# Patient Record
Sex: Female | Born: 1945 | Race: White | Hispanic: No | Marital: Married | Smoking: Never smoker
Health system: Southern US, Community
[De-identification: ages and names within clinical notes are randomized; demographics above are authoritative.]

## PROBLEM LIST (undated history)

## (undated) DIAGNOSIS — Z8719 Personal history of other diseases of the digestive system: Secondary | ICD-10-CM

## (undated) DIAGNOSIS — C4491 Basal cell carcinoma of skin, unspecified: Secondary | ICD-10-CM

## (undated) DIAGNOSIS — T7840XA Allergy, unspecified, initial encounter: Secondary | ICD-10-CM

## (undated) DIAGNOSIS — K635 Polyp of colon: Secondary | ICD-10-CM

## (undated) DIAGNOSIS — K649 Unspecified hemorrhoids: Secondary | ICD-10-CM

## (undated) DIAGNOSIS — C50919 Malignant neoplasm of unspecified site of unspecified female breast: Secondary | ICD-10-CM

## (undated) DIAGNOSIS — M810 Age-related osteoporosis without current pathological fracture: Secondary | ICD-10-CM

## (undated) DIAGNOSIS — R109 Unspecified abdominal pain: Secondary | ICD-10-CM

## (undated) DIAGNOSIS — C801 Malignant (primary) neoplasm, unspecified: Secondary | ICD-10-CM

## (undated) DIAGNOSIS — M722 Plantar fascial fibromatosis: Secondary | ICD-10-CM

## (undated) HISTORY — DX: Basal cell carcinoma of skin, unspecified: C44.91

## (undated) HISTORY — DX: Unspecified abdominal pain: R10.9

## (undated) HISTORY — DX: Polyp of colon: K63.5

## (undated) HISTORY — DX: Plantar fascial fibromatosis: M72.2

## (undated) HISTORY — DX: Malignant (primary) neoplasm, unspecified: C80.1

## (undated) HISTORY — DX: Personal history of other diseases of the digestive system: Z87.19

## (undated) HISTORY — DX: Allergy, unspecified, initial encounter: T78.40XA

## (undated) HISTORY — DX: Malignant neoplasm of unspecified site of unspecified female breast: C50.919

## (undated) HISTORY — DX: Unspecified hemorrhoids: K64.9

## (undated) HISTORY — DX: Age-related osteoporosis without current pathological fracture: M81.0

---

## 1968-04-07 HISTORY — PX: BREAST CYST EXCISION: SHX579

## 2000-04-07 DIAGNOSIS — C801 Malignant (primary) neoplasm, unspecified: Secondary | ICD-10-CM

## 2000-04-07 HISTORY — PX: MASTECTOMY: SHX3

## 2000-04-07 HISTORY — DX: Malignant (primary) neoplasm, unspecified: C80.1

## 2000-06-05 HISTORY — PX: OTHER SURGICAL HISTORY: SHX169

## 2000-06-08 ENCOUNTER — Other Ambulatory Visit: Admission: RE | Admit: 2000-06-08 | Discharge: 2000-06-08 | Payer: Self-pay | Admitting: Obstetrics & Gynecology

## 2000-06-10 ENCOUNTER — Encounter: Admission: RE | Admit: 2000-06-10 | Discharge: 2000-06-10 | Payer: Self-pay | Admitting: Obstetrics & Gynecology

## 2000-06-10 ENCOUNTER — Other Ambulatory Visit: Admission: RE | Admit: 2000-06-10 | Discharge: 2000-06-10 | Payer: Self-pay | Admitting: Obstetrics & Gynecology

## 2000-06-10 ENCOUNTER — Encounter (INDEPENDENT_AMBULATORY_CARE_PROVIDER_SITE_OTHER): Payer: Self-pay | Admitting: *Deleted

## 2000-06-10 ENCOUNTER — Encounter: Payer: Self-pay | Admitting: Obstetrics & Gynecology

## 2000-06-16 ENCOUNTER — Ambulatory Visit (HOSPITAL_COMMUNITY): Admission: RE | Admit: 2000-06-16 | Discharge: 2000-06-16 | Payer: Self-pay | Admitting: Surgery

## 2000-06-16 ENCOUNTER — Encounter: Payer: Self-pay | Admitting: Surgery

## 2000-06-18 ENCOUNTER — Ambulatory Visit (HOSPITAL_COMMUNITY): Admission: RE | Admit: 2000-06-18 | Discharge: 2000-06-18 | Payer: Self-pay | Admitting: Surgery

## 2000-06-18 ENCOUNTER — Encounter: Payer: Self-pay | Admitting: Surgery

## 2000-06-26 ENCOUNTER — Inpatient Hospital Stay (HOSPITAL_COMMUNITY): Admission: RE | Admit: 2000-06-26 | Discharge: 2000-06-27 | Payer: Self-pay | Admitting: Surgery

## 2000-06-26 ENCOUNTER — Encounter: Payer: Self-pay | Admitting: Surgery

## 2000-06-26 ENCOUNTER — Encounter (INDEPENDENT_AMBULATORY_CARE_PROVIDER_SITE_OTHER): Payer: Self-pay

## 2000-10-15 ENCOUNTER — Ambulatory Visit: Admission: RE | Admit: 2000-10-15 | Discharge: 2001-01-13 | Payer: Self-pay | Admitting: Radiation Oncology

## 2001-06-14 ENCOUNTER — Encounter: Payer: Self-pay | Admitting: Hematology and Oncology

## 2001-06-14 ENCOUNTER — Encounter: Admission: RE | Admit: 2001-06-14 | Discharge: 2001-06-14 | Payer: Self-pay | Admitting: Hematology and Oncology

## 2001-07-09 ENCOUNTER — Other Ambulatory Visit: Admission: RE | Admit: 2001-07-09 | Discharge: 2001-07-09 | Payer: Self-pay | Admitting: Family Medicine

## 2002-05-27 ENCOUNTER — Ambulatory Visit (HOSPITAL_COMMUNITY): Admission: RE | Admit: 2002-05-27 | Discharge: 2002-05-27 | Payer: Self-pay | Admitting: Oncology

## 2002-05-27 ENCOUNTER — Encounter: Payer: Self-pay | Admitting: Oncology

## 2002-09-09 ENCOUNTER — Encounter: Admission: RE | Admit: 2002-09-09 | Discharge: 2002-09-09 | Payer: Self-pay | Admitting: Oncology

## 2002-09-09 ENCOUNTER — Encounter: Payer: Self-pay | Admitting: Oncology

## 2003-01-27 ENCOUNTER — Other Ambulatory Visit: Admission: RE | Admit: 2003-01-27 | Discharge: 2003-01-27 | Payer: Self-pay | Admitting: Family Medicine

## 2003-10-31 ENCOUNTER — Encounter: Admission: RE | Admit: 2003-10-31 | Discharge: 2003-10-31 | Payer: Self-pay | Admitting: Surgery

## 2004-04-04 ENCOUNTER — Ambulatory Visit: Payer: Self-pay | Admitting: Family Medicine

## 2004-07-08 ENCOUNTER — Ambulatory Visit: Payer: Self-pay | Admitting: Oncology

## 2004-10-09 ENCOUNTER — Ambulatory Visit: Payer: Self-pay | Admitting: Family Medicine

## 2004-11-05 ENCOUNTER — Encounter: Admission: RE | Admit: 2004-11-05 | Discharge: 2004-11-05 | Payer: Self-pay | Admitting: Oncology

## 2004-11-29 ENCOUNTER — Encounter (INDEPENDENT_AMBULATORY_CARE_PROVIDER_SITE_OTHER): Payer: Self-pay | Admitting: *Deleted

## 2004-11-29 ENCOUNTER — Ambulatory Visit: Payer: Self-pay | Admitting: Family Medicine

## 2004-11-29 ENCOUNTER — Encounter: Payer: Self-pay | Admitting: Family Medicine

## 2004-11-29 ENCOUNTER — Other Ambulatory Visit: Admission: RE | Admit: 2004-11-29 | Discharge: 2004-11-29 | Payer: Self-pay | Admitting: Family Medicine

## 2004-11-29 LAB — CONVERTED CEMR LAB: Pap Smear: NORMAL

## 2005-07-15 ENCOUNTER — Ambulatory Visit: Payer: Self-pay | Admitting: Oncology

## 2005-07-15 LAB — COMPREHENSIVE METABOLIC PANEL
AST: 11 U/L (ref 0–37)
Albumin: 4.6 g/dL (ref 3.5–5.2)
Alkaline Phosphatase: 79 U/L (ref 39–117)
Potassium: 4.2 mEq/L (ref 3.5–5.3)
Sodium: 141 mEq/L (ref 135–145)
Total Protein: 7.2 g/dL (ref 6.0–8.3)

## 2005-07-15 LAB — CBC WITH DIFFERENTIAL/PLATELET
BASO%: 0.5 % (ref 0.0–2.0)
EOS%: 2.3 % (ref 0.0–7.0)
HGB: 14 g/dL (ref 11.6–15.9)
MCH: 32 pg (ref 26.0–34.0)
MCHC: 34.5 g/dL (ref 32.0–36.0)
RDW: 11.9 % (ref 11.3–14.5)
lymph#: 1.2 10*3/uL (ref 0.9–3.3)

## 2005-12-23 ENCOUNTER — Encounter: Admission: RE | Admit: 2005-12-23 | Discharge: 2005-12-23 | Payer: Self-pay | Admitting: Surgery

## 2005-12-29 ENCOUNTER — Encounter: Admission: RE | Admit: 2005-12-29 | Discharge: 2005-12-29 | Payer: Self-pay | Admitting: Surgery

## 2005-12-30 ENCOUNTER — Encounter: Admission: RE | Admit: 2005-12-30 | Discharge: 2005-12-30 | Payer: Self-pay | Admitting: Surgery

## 2006-07-09 ENCOUNTER — Ambulatory Visit: Payer: Self-pay | Admitting: Oncology

## 2006-09-02 ENCOUNTER — Ambulatory Visit: Payer: Self-pay | Admitting: Oncology

## 2006-09-04 LAB — CBC WITH DIFFERENTIAL/PLATELET
Basophils Absolute: 0 10*3/uL (ref 0.0–0.1)
EOS%: 2 % (ref 0.0–7.0)
MCH: 32.6 pg (ref 26.0–34.0)
MCV: 91.3 fL (ref 81.0–101.0)
MONO%: 5.5 % (ref 0.0–13.0)
RBC: 4.16 10*6/uL (ref 3.70–5.32)
RDW: 12.3 % (ref 11.3–14.5)

## 2006-09-04 LAB — COMPREHENSIVE METABOLIC PANEL
ALT: 11 U/L (ref 0–35)
Alkaline Phosphatase: 86 U/L (ref 39–117)
CO2: 23 mEq/L (ref 19–32)
Creatinine, Ser: 0.75 mg/dL (ref 0.40–1.20)
Glucose, Bld: 90 mg/dL (ref 70–99)
Sodium: 140 mEq/L (ref 135–145)
Total Bilirubin: 0.6 mg/dL (ref 0.3–1.2)
Total Protein: 6.7 g/dL (ref 6.0–8.3)

## 2006-09-04 LAB — CANCER ANTIGEN 27.29: CA 27.29: 27 U/mL (ref 0–39)

## 2007-03-16 ENCOUNTER — Encounter: Admission: RE | Admit: 2007-03-16 | Discharge: 2007-03-16 | Payer: Self-pay | Admitting: Surgery

## 2007-08-25 ENCOUNTER — Ambulatory Visit: Payer: Self-pay | Admitting: Oncology

## 2007-09-03 LAB — CBC WITH DIFFERENTIAL/PLATELET
BASO%: 0.4 % (ref 0.0–2.0)
Basophils Absolute: 0 10*3/uL (ref 0.0–0.1)
Eosinophils Absolute: 0.1 10*3/uL (ref 0.0–0.5)
HCT: 42 % (ref 34.8–46.6)
HGB: 14.6 g/dL (ref 11.6–15.9)
MCHC: 34.7 g/dL (ref 32.0–36.0)
MONO#: 0.4 10*3/uL (ref 0.1–0.9)
NEUT#: 4.7 10*3/uL (ref 1.5–6.5)
NEUT%: 72.7 % (ref 39.6–76.8)
WBC: 6.5 10*3/uL (ref 3.9–10.0)
lymph#: 1.3 10*3/uL (ref 0.9–3.3)

## 2007-09-03 LAB — COMPREHENSIVE METABOLIC PANEL
ALT: 15 U/L (ref 0–35)
CO2: 25 mEq/L (ref 19–32)
Calcium: 9.5 mg/dL (ref 8.4–10.5)
Chloride: 106 mEq/L (ref 96–112)
Creatinine, Ser: 0.76 mg/dL (ref 0.40–1.20)
Glucose, Bld: 103 mg/dL — ABNORMAL HIGH (ref 70–99)

## 2007-09-03 LAB — CANCER ANTIGEN 27.29: CA 27.29: 34 U/mL (ref 0–39)

## 2007-09-13 ENCOUNTER — Encounter: Payer: Self-pay | Admitting: Family Medicine

## 2007-09-13 ENCOUNTER — Encounter: Payer: Self-pay | Admitting: Internal Medicine

## 2007-10-05 ENCOUNTER — Encounter: Payer: Self-pay | Admitting: Internal Medicine

## 2008-01-28 ENCOUNTER — Encounter: Payer: Self-pay | Admitting: Family Medicine

## 2008-03-14 ENCOUNTER — Encounter: Admission: RE | Admit: 2008-03-14 | Discharge: 2008-03-14 | Payer: Self-pay | Admitting: Surgery

## 2008-06-16 ENCOUNTER — Encounter (INDEPENDENT_AMBULATORY_CARE_PROVIDER_SITE_OTHER): Payer: Self-pay | Admitting: *Deleted

## 2008-06-16 ENCOUNTER — Encounter: Payer: Self-pay | Admitting: Family Medicine

## 2008-06-16 ENCOUNTER — Other Ambulatory Visit: Admission: RE | Admit: 2008-06-16 | Discharge: 2008-06-16 | Payer: Self-pay | Admitting: Family Medicine

## 2008-06-16 ENCOUNTER — Ambulatory Visit: Payer: Self-pay | Admitting: Family Medicine

## 2008-06-16 DIAGNOSIS — Z853 Personal history of malignant neoplasm of breast: Secondary | ICD-10-CM

## 2008-06-16 DIAGNOSIS — M810 Age-related osteoporosis without current pathological fracture: Secondary | ICD-10-CM

## 2008-06-22 ENCOUNTER — Encounter (INDEPENDENT_AMBULATORY_CARE_PROVIDER_SITE_OTHER): Payer: Self-pay | Admitting: *Deleted

## 2008-06-23 ENCOUNTER — Ambulatory Visit: Payer: Self-pay | Admitting: Family Medicine

## 2008-06-23 DIAGNOSIS — E7800 Pure hypercholesterolemia, unspecified: Secondary | ICD-10-CM

## 2008-06-30 LAB — CONVERTED CEMR LAB: Vit D, 25-Hydroxy: 19 ng/mL — ABNORMAL LOW (ref 30–89)

## 2008-07-04 ENCOUNTER — Encounter (INDEPENDENT_AMBULATORY_CARE_PROVIDER_SITE_OTHER): Payer: Self-pay | Admitting: *Deleted

## 2008-07-04 LAB — CONVERTED CEMR LAB
BUN: 17 mg/dL (ref 6–23)
Basophils Absolute: 0 10*3/uL (ref 0.0–0.1)
Bilirubin, Direct: 0 mg/dL (ref 0.0–0.3)
Chloride: 110 meq/L (ref 96–112)
Cholesterol: 214 mg/dL — ABNORMAL HIGH (ref 0–200)
Creatinine, Ser: 0.7 mg/dL (ref 0.4–1.2)
Direct LDL: 138.6 mg/dL
GFR calc non Af Amer: 90.19 mL/min (ref >60)
Glucose, Bld: 88 mg/dL (ref 70–99)
HCT: 40.6 % (ref 36.0–46.0)
HDL: 52.6 mg/dL (ref >39.00)
Lymphs Abs: 1.3 10*3/uL (ref 0.7–4.0)
MCV: 94.6 fL (ref 78.0–100.0)
Monocytes Absolute: 0.3 10*3/uL (ref 0.1–1.0)
Neutrophils Relative %: 61.3 % (ref 43.0–77.0)
Platelets: 146 10*3/uL — ABNORMAL LOW (ref 150.0–400.0)
Potassium: 4.5 meq/L (ref 3.5–5.1)
RDW: 11.4 % — ABNORMAL LOW (ref 11.5–14.6)
TSH: 1.53 microintl units/mL (ref 0.35–5.50)
Total Bilirubin: 1.1 mg/dL (ref 0.3–1.2)
VLDL: 21 mg/dL (ref 0.0–40.0)
WBC: 4.6 10*3/uL (ref 4.5–10.5)

## 2008-07-18 ENCOUNTER — Ambulatory Visit: Payer: Self-pay | Admitting: Family Medicine

## 2008-09-29 ENCOUNTER — Ambulatory Visit: Payer: Self-pay | Admitting: Family Medicine

## 2008-10-04 LAB — CONVERTED CEMR LAB
ALT: 14 units/L (ref 0–35)
AST: 19 units/L (ref 0–37)
Cholesterol: 225 mg/dL — ABNORMAL HIGH (ref 0–200)
Vit D, 25-Hydroxy: 38 ng/mL (ref 30–89)

## 2008-11-22 ENCOUNTER — Ambulatory Visit: Payer: Self-pay | Admitting: Internal Medicine

## 2008-12-06 ENCOUNTER — Ambulatory Visit: Payer: Self-pay | Admitting: Internal Medicine

## 2008-12-06 ENCOUNTER — Encounter: Payer: Self-pay | Admitting: Internal Medicine

## 2008-12-06 LAB — HM COLONOSCOPY

## 2008-12-08 ENCOUNTER — Encounter: Payer: Self-pay | Admitting: Internal Medicine

## 2009-03-16 ENCOUNTER — Encounter: Admission: RE | Admit: 2009-03-16 | Discharge: 2009-03-16 | Payer: Self-pay | Admitting: Surgery

## 2009-03-20 ENCOUNTER — Encounter (INDEPENDENT_AMBULATORY_CARE_PROVIDER_SITE_OTHER): Payer: Self-pay | Admitting: *Deleted

## 2010-04-19 ENCOUNTER — Encounter
Admission: RE | Admit: 2010-04-19 | Discharge: 2010-04-19 | Payer: Self-pay | Source: Home / Self Care | Attending: Surgery | Admitting: Surgery

## 2010-04-19 LAB — HM MAMMOGRAPHY

## 2010-04-26 ENCOUNTER — Encounter
Admission: RE | Admit: 2010-04-26 | Discharge: 2010-04-26 | Payer: Self-pay | Source: Home / Self Care | Attending: Surgery | Admitting: Surgery

## 2010-04-26 ENCOUNTER — Encounter: Payer: Self-pay | Admitting: Family Medicine

## 2010-05-06 ENCOUNTER — Other Ambulatory Visit: Payer: Self-pay | Admitting: Surgery

## 2010-05-06 DIAGNOSIS — N6001 Solitary cyst of right breast: Secondary | ICD-10-CM

## 2010-05-06 DIAGNOSIS — Z9012 Acquired absence of left breast and nipple: Secondary | ICD-10-CM

## 2010-05-06 DIAGNOSIS — R922 Inconclusive mammogram: Secondary | ICD-10-CM

## 2010-05-23 NOTE — Letter (Signed)
Summary: Dr Jamey Ripa note  Dr Jamey Ripa note   Imported By: Kassie Mends 05/10/2010 11:34:57  _____________________________________________________________________  External Attachment:    Type:   Image     Comment:   External Document

## 2010-05-31 ENCOUNTER — Ambulatory Visit
Admission: RE | Admit: 2010-05-31 | Discharge: 2010-05-31 | Disposition: A | Discharge status: Home / Self Care | Payer: Self-pay | Source: Ambulatory Visit | Attending: Surgery | Admitting: Surgery

## 2010-05-31 DIAGNOSIS — Z9012 Acquired absence of left breast and nipple: Secondary | ICD-10-CM

## 2010-05-31 DIAGNOSIS — N6001 Solitary cyst of right breast: Secondary | ICD-10-CM

## 2010-05-31 DIAGNOSIS — R922 Inconclusive mammogram: Secondary | ICD-10-CM

## 2010-05-31 MED ORDER — GADOBENATE DIMEGLUMINE 529 MG/ML IV SOLN
15.0000 mL | Freq: Once | INTRAVENOUS | Status: AC | PRN
  Administered 2010-05-31: 15 mL via INTRAVENOUS

## 2010-05-31 MED ORDER — GADOBENATE DIMEGLUMINE 529 MG/ML IV SOLN: 15.0000 mL | Freq: Once | INTRAVENOUS | Status: DC | PRN

## 2010-08-23 NOTE — Op Note (Signed)
[place]  Patient:    Tina Mendez, Tina Mendez                        MRN: [id number] Proc. Date: 06/26/00 Adm. Date:  [id number] Disc. Date: [id number] Attending:  Charlton Haws CC:         Freddy Finner, M.D.  Dr. ___________ [facility]  [place] C. Catha Gosselin, M.D.   Operative Report  ACCOUNT:  [id number]  OFFICE MEDICAL RECORD NUMBER:  [id number]  PREOPERATIVE DIAGNOSIS:  Carcinoma of the left breast.  POSTOPERATIVE DIAGNOSIS:  Carcinoma of the left breast.  OPERATION:  Left total mastectomy with blue dye injection and sentinel node dissection.  SURGEON:  Currie Paris, M.D.  ASSISTANT:  Emiliano Dyer, R.N.F.A.  CLINICAL HISTORY:  This patient is a 65 year old, who has recently presented with a left breast mass which looked malignant on mammography and was palpable.  It was medially located in the upper inner quadrant.  Cold biopsy showed invasive carcinoma.  She had already seen Dr. Lyndal Pulley for preoperative evaluation for chemo, and it was elected to proceed to total mastectomy because this was felt to be too large for a successful partial mastectomy and sentinel node dissection with a possible full axillary dissection.  DESCRIPTION OF PROCEDURE:  The patient was brought to the operating room and having then had the breast mass palpated in the holding area and marked.  She already been injected with her radioactive nucleotide.  After satisfactory general endotracheal anesthesia had been obtained, the breast was prepped and draped.  I outlined an elliptical incision to be made. I injected 5 cc of Lymphazurin blue, most of which was subareolar, but a little bit in the dermal area over the tumor.  We had several minutes for that to soak in and then began the mastectomy.  I made the superior incision and raised a skin flap medially to the sternum superiorly almost to the clavicle, laterally to the edge of the pectoralis, and  then lifted the axillary skin up. I had already used a needle probe to identify a hot area and once I got the skin elevated off of that, using needle probe again to divide down through some of the subcutaneous tissue until I entered some axillary fat.  I found a blue-green lymphatic vessel, and it tracked towards where our hot area was and tracing this out, found a hot blue node which was grasped with a Babcock and excised.  Using the needle probe, I found what appeared to be two other nodes, but actually one of them was two nodes together, and these were all hot but not blue.  They were dissected, and all were sent for touch preps.  These all subsequently returned negative.  While waiting for those to return, we went ahead and completed the mastectomy. The inferior incision was made and skin flap raised inferiorly to the inframammary followed laterally to the latissimus dorsi.  The breasts was dissected off the underlying muscles, starting medially and superiorly, and working inferiorly and laterally.  The fascia was taken.  The final attachments to the latissimus were divided and then the final attachments to the fatty tissue and the axilla were also divided.  When I had completed the initial sentinel node dissection, there were no other hot areas noted nor any other blue areas nor any palpable adenopathy.  This completed the mastectomy.  The pathologist had reported that the nodes were negative by  touch preps.  The wound was irrigated and checked for hemostasis.  Everything appeared to be completely dry.  The 19 Blake drains were placed through the inferior flap, one into the axilla, one over the pectoralis.  Another check was made for hemostasis and the wound then closed with staples.  There was a little bit of tension medially but seemed to close okay.  The drains were secured with a nylon.  The patient tolerated the procedure well.  There were no operative complications.  All  counts were correct. DD:  06/26/00 TD:  06/28/00 Job: [id number]

## 2010-08-27 ENCOUNTER — Encounter (INDEPENDENT_AMBULATORY_CARE_PROVIDER_SITE_OTHER): Payer: Self-pay | Admitting: Surgery

## 2011-03-13 ENCOUNTER — Ambulatory Visit: Payer: Self-pay

## 2011-03-20 ENCOUNTER — Encounter (INDEPENDENT_AMBULATORY_CARE_PROVIDER_SITE_OTHER): Payer: Self-pay | Admitting: Surgery

## 2011-03-28 ENCOUNTER — Other Ambulatory Visit (INDEPENDENT_AMBULATORY_CARE_PROVIDER_SITE_OTHER): Payer: Self-pay | Admitting: Surgery

## 2011-03-28 DIAGNOSIS — Z1231 Encounter for screening mammogram for malignant neoplasm of breast: Secondary | ICD-10-CM

## 2011-03-28 DIAGNOSIS — Z9012 Acquired absence of left breast and nipple: Secondary | ICD-10-CM

## 2011-05-16 ENCOUNTER — Ambulatory Visit
Admission: RE | Admit: 2011-05-16 | Discharge: 2011-05-16 | Disposition: A | Discharge status: Home / Self Care | Payer: Federal, State, Local not specified - PPO | Source: Ambulatory Visit | Attending: Surgery | Admitting: Surgery

## 2011-05-16 DIAGNOSIS — Z9012 Acquired absence of left breast and nipple: Secondary | ICD-10-CM

## 2011-05-16 DIAGNOSIS — Z1231 Encounter for screening mammogram for malignant neoplasm of breast: Secondary | ICD-10-CM

## 2011-05-27 ENCOUNTER — Encounter: Payer: Self-pay | Admitting: *Deleted

## 2011-06-05 ENCOUNTER — Encounter (INDEPENDENT_AMBULATORY_CARE_PROVIDER_SITE_OTHER): Payer: Self-pay | Admitting: General Surgery

## 2011-06-06 ENCOUNTER — Encounter (INDEPENDENT_AMBULATORY_CARE_PROVIDER_SITE_OTHER): Payer: Self-pay | Admitting: Surgery

## 2011-06-06 ENCOUNTER — Ambulatory Visit (INDEPENDENT_AMBULATORY_CARE_PROVIDER_SITE_OTHER): Payer: Federal, State, Local not specified - PPO | Admitting: Surgery

## 2011-06-06 VITALS — BP 118/74 | HR 80 | Temp 97.0°F | Resp 16 | Ht 63.0 in | Wt 164.2 lb

## 2011-06-06 DIAGNOSIS — Z853 Personal history of malignant neoplasm of breast: Secondary | ICD-10-CM

## 2011-06-06 NOTE — Progress Notes (Signed)
NAME: [name]       DOB: 09-05-1945           DATE: 06/06/2011       MRN: [id number]   Tina Mendez is a 66 y.o.Marland Kitchenfemale who presents for routine followup of her Left breast cancer diagnosed in 2002 and treated with mastectomy and chemo. She has no problems or concerns on either side.  PFSH: She has had no significant changes since the last visit here.  ROS: There have been no significant changes since the last visit here  EXAM: General: The patient is alert, oriented, generally healty appearing, NAD. Mood and affect are normal.  Breasts:  Right is normal. Left is s/p mastectomy and no evidence of recurrence  Lymphatics: She has no axillary or supraclavicular adenopathy on either side.  Extremities: Full ROM of the surgical side with no lymphedema noted.  Data Reviewed: Mammogram is OK  Impression: Doing well, with no evidence of recurrent cancer or new cancer  Plan: RTC PRN

## 2011-06-06 NOTE — Patient Instructions (Signed)
We will see you again on an as needed basis. Please call the office at [phone] if you have any questions or concerns. Thank you for allowing us to take care of you.  

## 2011-09-26 ENCOUNTER — Encounter: Payer: Self-pay | Admitting: Family Medicine

## 2011-09-26 ENCOUNTER — Ambulatory Visit (INDEPENDENT_AMBULATORY_CARE_PROVIDER_SITE_OTHER): Payer: Medicare Other | Admitting: Family Medicine

## 2011-09-26 ENCOUNTER — Telehealth: Payer: Self-pay | Admitting: Family Medicine

## 2011-09-26 VITALS — BP 120/70 | HR 78 | Temp 98.3°F | Wt 162.0 lb

## 2011-09-26 DIAGNOSIS — L738 Other specified follicular disorders: Secondary | ICD-10-CM

## 2011-09-26 DIAGNOSIS — L739 Follicular disorder, unspecified: Secondary | ICD-10-CM | POA: Insufficient documentation

## 2011-09-26 MED ORDER — DOXYCYCLINE HYCLATE 100 MG PO TABS: 100.0000 mg | ORAL_TABLET | Freq: Two times a day (BID) | ORAL | Status: AC

## 2011-09-26 NOTE — Assessment & Plan Note (Signed)
Presumed MRSA folliculitis.  Doxy, called in.  Use dial soap and avoid sharing washcloths.  Should resolve, f/u prn.

## 2011-09-26 NOTE — Progress Notes (Signed)
Grandson had a rash, thought to be due to insect bites.  Then was rechecked when not improved on amoxil and was diagnosed with MRSA.  That was 1 week ago, MRSA on culture per patient report.  Pt with no FCNAVD.  Now patient has bumps noted in groin and on legs. Not itchy.  No tick bites that were engorged.  She had some ticks on her prev, but not attached.    Meds, vitals, and allergies reviewed.   ROS: See HPI.  Otherwise, noncontributory.  nad Chaperoned exam with mult ~1cm blanching red area along the B proximal thighs with central white area, some associated with a hair follicle.  No fluctuant mass.

## 2011-09-26 NOTE — Telephone Encounter (Signed)
Caller: Manuel/Patient; PCP: Roxy Manns A.; CB#: [phone]; Calling today 09/26/11 regarding she has a rash.  Has a grandson who is 3 who recently broke out in a rash and was taken to MD and was told it was insect bites.  Went back again b/c rash not better and rash was positive for MRSA.  Grandmother said last night she noticed about 12 bumps in groin area that are red and look like a pimples but do not hurt or itch.  Says look like they may have pus in them.  Rash is in her panty line area and on back of her leg.  Afebrile.  Emergent symptoms r/o by Skin Lesions guidelines with exception of new signs and symptoms of local infection. Appt scheduled for today 09/26/11 at 4 PM with Dr. Para March.

## 2011-09-26 NOTE — Patient Instructions (Addendum)
Start the doxycycline today.  Use dial soap and don't share washcloths.  This should gradually improve.  If fever or significant pain, notify the clinic.

## 2011-12-22 DIAGNOSIS — L570 Actinic keratosis: Secondary | ICD-10-CM | POA: Diagnosis not present

## 2011-12-22 DIAGNOSIS — L821 Other seborrheic keratosis: Secondary | ICD-10-CM | POA: Diagnosis not present

## 2011-12-22 DIAGNOSIS — L981 Factitial dermatitis: Secondary | ICD-10-CM | POA: Diagnosis not present

## 2011-12-22 DIAGNOSIS — L820 Inflamed seborrheic keratosis: Secondary | ICD-10-CM | POA: Diagnosis not present

## 2011-12-22 DIAGNOSIS — D239 Other benign neoplasm of skin, unspecified: Secondary | ICD-10-CM | POA: Diagnosis not present

## 2012-01-09 ENCOUNTER — Encounter: Payer: Self-pay | Admitting: Internal Medicine

## 2012-03-12 ENCOUNTER — Telehealth (INDEPENDENT_AMBULATORY_CARE_PROVIDER_SITE_OTHER): Payer: Self-pay

## 2012-03-12 NOTE — Telephone Encounter (Signed)
Pt called from [facility] requesting rx be sent today for Mastectomy bras and prosthesis. Pt requests no # of supplies be put on rx. She request signed rx be faxed to [phone] [facility]. Pt advised I will send request to Dr Jamey Ripa.

## 2012-03-12 NOTE — Telephone Encounter (Signed)
Rx sent to [facility]. Confirmation received.

## 2012-04-29 DIAGNOSIS — L578 Other skin changes due to chronic exposure to nonionizing radiation: Secondary | ICD-10-CM | POA: Diagnosis not present

## 2012-04-29 DIAGNOSIS — C4401 Basal cell carcinoma of skin of lip: Secondary | ICD-10-CM | POA: Diagnosis not present

## 2012-04-29 DIAGNOSIS — L819 Disorder of pigmentation, unspecified: Secondary | ICD-10-CM | POA: Diagnosis not present

## 2012-04-29 DIAGNOSIS — D485 Neoplasm of uncertain behavior of skin: Secondary | ICD-10-CM | POA: Diagnosis not present

## 2012-04-29 DIAGNOSIS — L570 Actinic keratosis: Secondary | ICD-10-CM | POA: Diagnosis not present

## 2012-04-29 DIAGNOSIS — Z85828 Personal history of other malignant neoplasm of skin: Secondary | ICD-10-CM | POA: Diagnosis not present

## 2012-06-30 DIAGNOSIS — C4401 Basal cell carcinoma of skin of lip: Secondary | ICD-10-CM | POA: Diagnosis not present

## 2012-07-05 DIAGNOSIS — Z85828 Personal history of other malignant neoplasm of skin: Secondary | ICD-10-CM | POA: Diagnosis not present

## 2012-07-05 DIAGNOSIS — L570 Actinic keratosis: Secondary | ICD-10-CM | POA: Diagnosis not present

## 2012-07-05 DIAGNOSIS — L578 Other skin changes due to chronic exposure to nonionizing radiation: Secondary | ICD-10-CM | POA: Diagnosis not present

## 2012-07-16 ENCOUNTER — Other Ambulatory Visit: Payer: Self-pay

## 2012-07-16 ENCOUNTER — Encounter: Payer: Self-pay | Admitting: Internal Medicine

## 2012-07-16 DIAGNOSIS — Z853 Personal history of malignant neoplasm of breast: Secondary | ICD-10-CM

## 2012-07-16 DIAGNOSIS — Z1231 Encounter for screening mammogram for malignant neoplasm of breast: Secondary | ICD-10-CM

## 2012-07-16 DIAGNOSIS — Z9012 Acquired absence of left breast and nipple: Secondary | ICD-10-CM

## 2012-08-12 ENCOUNTER — Ambulatory Visit
Admission: RE | Admit: 2012-08-12 | Discharge: 2012-08-12 | Disposition: A | Discharge status: Home / Self Care | Payer: Medicare Other | Source: Ambulatory Visit

## 2012-08-12 DIAGNOSIS — Z9012 Acquired absence of left breast and nipple: Secondary | ICD-10-CM

## 2012-08-12 DIAGNOSIS — Z853 Personal history of malignant neoplasm of breast: Secondary | ICD-10-CM

## 2012-08-12 DIAGNOSIS — Z1231 Encounter for screening mammogram for malignant neoplasm of breast: Secondary | ICD-10-CM | POA: Diagnosis not present

## 2012-08-16 ENCOUNTER — Encounter: Payer: Self-pay | Admitting: *Deleted

## 2012-08-16 ENCOUNTER — Encounter: Payer: Self-pay | Admitting: Family Medicine

## 2012-08-25 ENCOUNTER — Ambulatory Visit (AMBULATORY_SURGERY_CENTER): Payer: Medicare Other | Admitting: *Deleted

## 2012-08-25 VITALS — Ht 63.0 in | Wt 152.2 lb

## 2012-08-25 DIAGNOSIS — Z1211 Encounter for screening for malignant neoplasm of colon: Secondary | ICD-10-CM

## 2012-08-25 DIAGNOSIS — Z860100 Personal history of colon polyps, unspecified: Secondary | ICD-10-CM

## 2012-08-25 MED ORDER — MOVIPREP 100 G PO SOLR: ORAL | Status: DC

## 2012-09-10 ENCOUNTER — Ambulatory Visit (AMBULATORY_SURGERY_CENTER): Payer: Medicare Other | Admitting: Internal Medicine

## 2012-09-10 ENCOUNTER — Encounter: Payer: Self-pay | Admitting: Internal Medicine

## 2012-09-10 VITALS — BP 122/64 | HR 61 | Temp 97.4°F | Resp 22 | Ht 63.0 in | Wt 152.0 lb

## 2012-09-10 DIAGNOSIS — Z86010 Personal history of colonic polyps: Secondary | ICD-10-CM

## 2012-09-10 DIAGNOSIS — Z1211 Encounter for screening for malignant neoplasm of colon: Secondary | ICD-10-CM | POA: Diagnosis not present

## 2012-09-10 DIAGNOSIS — D126 Benign neoplasm of colon, unspecified: Secondary | ICD-10-CM | POA: Diagnosis not present

## 2012-09-10 DIAGNOSIS — E785 Hyperlipidemia, unspecified: Secondary | ICD-10-CM | POA: Diagnosis not present

## 2012-09-10 MED ORDER — SODIUM CHLORIDE 0.9 % IV SOLN: 500.0000 mL | INTRAVENOUS | Status: DC

## 2012-09-10 NOTE — Progress Notes (Signed)
Called to room to assist during endoscopic procedure.  Patient ID and intended procedure confirmed with present staff. Received instructions for my participation in the procedure from the performing physician.  

## 2012-09-10 NOTE — Op Note (Signed)
[facility] [address] [place], [address]   COLONOSCOPY PROCEDURE REPORT  PATIENT: Tina Mendez, Tina Mendez  MR#: [id number] BIRTHDATE: 1945/05/08 , 66  yrs. old GENDER: Female ENDOSCOPIST: Roxy Cedar, MD REFERRED NW:[id number] Program Recall PROCEDURE DATE:  09/10/2012 PROCEDURE:   Colonoscopy with snare polypectomy  x 1 ASA CLASS:   Class II INDICATIONS:Patient's personal history of adenomatous colon polyps. Index 2003 (-);   12-2008 w/ 3 adenomas MEDICATIONS: MAC sedation, administered by CRNA and propofol (Diprivan) 280mg  IV  DESCRIPTION OF PROCEDURE:   After the risks benefits and alternatives of the procedure were thoroughly explained, informed consent was obtained.  A digital rectal exam revealed hemorrhoids. The LB [id number]  endoscope was introduced through the anus and advanced to the cecum, which was identified by both the appendix and ileocecal valve. No adverse events experienced.   The quality of the prep was excellent, using MoviPrep  The instrument was then slowly withdrawn as the colon was fully examined.      COLON FINDINGS: A diminutive polyp was found in the transverse colon.  A polypectomy was performed with a cold snare.  The resection was complete and the polyp tissue was completely retrieved.   The colon mucosa was otherwise normal.  Retroflexed views revealed no abnormalities. The time to cecum=3 minutes 41 seconds.  Withdrawal time=12 minutes 27 seconds.  The scope was withdrawn and the procedure completed. COMPLICATIONS: There were no complications.  ENDOSCOPIC IMPRESSION: 1.   Diminutive polyp was found in the transverse colon; polypectomy was performed with a cold snare 2.   The colon mucosa was otherwise normal  RECOMMENDATIONS: 1. Follow up colonoscopy in 5 years   eSigned:  Roxy Cedar, MD 09/10/2012 10:03 AM   cc: Judy Pimple, MD and The Patient

## 2012-09-10 NOTE — Progress Notes (Signed)
NO EGG OR SOY ALLERGY. EWM 

## 2012-09-10 NOTE — Patient Instructions (Signed)
YOU HAD AN ENDOSCOPIC PROCEDURE TODAY AT THE [facility]: Refer to the procedure report that was given to you for any specific questions about what was found during the examination.  If the procedure report does not answer your questions, please call your gastroenterologist to clarify.  If you requested that your care partner not be given the details of your procedure findings, then the procedure report has been included in a sealed envelope for you to review at your convenience later.  YOU SHOULD EXPECT: Some feelings of bloating in the abdomen. Passage of more gas than usual.  Walking can help get rid of the air that was put into your GI tract during the procedure and reduce the bloating. If you had a lower endoscopy (such as a colonoscopy or flexible sigmoidoscopy) you may notice spotting of blood in your stool or on the toilet paper. If you underwent a bowel prep for your procedure, then you may not have a normal bowel movement for a few days.  DIET: Your first meal following the procedure should be a light meal and then it is ok to progress to your normal diet.  A half-sandwich or bowl of soup is an example of a good first meal.  Heavy or fried foods are harder to digest and may make you feel nauseous or bloated.  Likewise meals heavy in dairy and vegetables can cause extra gas to form and this can also increase the bloating.  Drink plenty of fluids but you should avoid alcoholic beverages for 24 hours.  ACTIVITY: Your care partner should take you home directly after the procedure.  You should plan to take it easy, moving slowly for the rest of the day.  You can resume normal activity the day after the procedure however you should NOT DRIVE or use heavy machinery for 24 hours (because of the sedation medicines used during the test).    SYMPTOMS TO REPORT IMMEDIATELY: A gastroenterologist can be reached at any hour.  During normal business hours, 8:30 AM to 5:00 PM Monday through Friday,  call [phone].  After hours and on weekends, please call the GI answering service at [phone] who will take a message and have the physician on call contact you.   Following lower endoscopy (colonoscopy or flexible sigmoidoscopy):  Excessive amounts of blood in the stool  Significant tenderness or worsening of abdominal pains  Swelling of the abdomen that is new, acute  Fever of 100F or higher    FOLLOW UP: If any biopsies were taken you will be contacted by phone or by letter within the next 1-3 weeks.  Call your gastroenterologist if you have not heard about the biopsies in 3 weeks.  Our staff will call the home number listed on your records the next business day following your procedure to check on you and address any questions or concerns that you may have at that time regarding the information given to you following your procedure. This is a courtesy call and so if there is no answer at the home number and we have not heard from you through the emergency physician on call, we will assume that you have returned to your regular daily activities without incident.  SIGNATURES/CONFIDENTIALITY: You and/or your care partner have signed paperwork which will be entered into your electronic medical record.  These signatures attest to the fact that that the information above on your After Visit Summary has been reviewed and is understood.  Full responsibility of the confidentiality   of this discharge information lies with you and/or your care-partner.    Information on polyps given to you today 

## 2012-09-10 NOTE — Progress Notes (Signed)
Patient did not have preoperative order for IV antibiotic SSI prophylaxis. (G8918)Patient did not have preoperative order for IV antibiotic SSI prophylaxis. (G8918)Patient did not experience any of the following events: a burn prior to discharge; a fall within the facility; wrong site/side/patient/procedure/implant event; or a hospital transfer or hospital admission upon discharge from the facility. (G8907) 

## 2012-09-10 NOTE — Progress Notes (Signed)
Report to pacu rn, vss, bbs=clear 

## 2012-09-13 ENCOUNTER — Telehealth: Payer: Self-pay | Admitting: *Deleted

## 2012-09-13 NOTE — Telephone Encounter (Signed)
  Follow up Call-  Call back number 09/10/2012  Post procedure Call Back phone  # [phone]  Permission to leave phone message Yes     Patient questions:  Do you have a fever, pain , or abdominal swelling? no Pain Score  0 *  Have you tolerated food without any problems? yes  Have you been able to return to your normal activities? yes  Do you have any questions about your discharge instructions: Diet   no Medications  no Follow up visit  no  Do you have questions or concerns about your Care? no  Actions: * If pain score is 4 or above: No action needed, pain <4.

## 2012-09-14 ENCOUNTER — Encounter: Payer: Self-pay | Admitting: Internal Medicine

## 2012-11-10 ENCOUNTER — Telehealth: Payer: Self-pay | Admitting: Family Medicine

## 2012-11-10 DIAGNOSIS — Z0000 Encounter for general adult medical examination without abnormal findings: Secondary | ICD-10-CM | POA: Insufficient documentation

## 2012-11-10 DIAGNOSIS — M899 Disorder of bone, unspecified: Secondary | ICD-10-CM

## 2012-11-10 DIAGNOSIS — E7800 Pure hypercholesterolemia, unspecified: Secondary | ICD-10-CM

## 2012-11-10 NOTE — Telephone Encounter (Signed)
Message copied by Judy Pimple on Wed Nov 10, 2012 10:55 PM ------      Message from: Baldomero Lamy      Created: Tue Oct 26, 2012 12:48 PM      Regarding: Cpx labs Thurs 8/7       Please order  future cpx labs for pt's upcoming lab appt.      Thanks      Tasha       ------

## 2012-11-11 ENCOUNTER — Other Ambulatory Visit (INDEPENDENT_AMBULATORY_CARE_PROVIDER_SITE_OTHER): Payer: Medicare Other

## 2012-11-11 DIAGNOSIS — M899 Disorder of bone, unspecified: Secondary | ICD-10-CM

## 2012-11-11 DIAGNOSIS — Z0000 Encounter for general adult medical examination without abnormal findings: Secondary | ICD-10-CM | POA: Diagnosis not present

## 2012-11-11 DIAGNOSIS — E7800 Pure hypercholesterolemia, unspecified: Secondary | ICD-10-CM

## 2012-11-11 DIAGNOSIS — M949 Disorder of cartilage, unspecified: Secondary | ICD-10-CM | POA: Diagnosis not present

## 2012-11-11 LAB — LIPID PANEL
Cholesterol: 228 mg/dL — ABNORMAL HIGH (ref 0–200)
HDL: 59.5 mg/dL (ref >39.00)
Total CHOL/HDL Ratio: 4
Triglycerides: 107 mg/dL (ref 0.0–149.0)
VLDL: 21.4 mg/dL (ref 0.0–40.0)

## 2012-11-11 LAB — COMPREHENSIVE METABOLIC PANEL
Alkaline Phosphatase: 76 U/L (ref 39–117)
BUN: 15 mg/dL (ref 6–23)
Creatinine, Ser: 0.8 mg/dL (ref 0.4–1.2)
GFR: 82.13 mL/min (ref >60.00)
Glucose, Bld: 98 mg/dL (ref 70–99)
Sodium: 141 mEq/L (ref 135–145)
Total Bilirubin: 1.3 mg/dL — ABNORMAL HIGH (ref 0.3–1.2)
Total Protein: 7.1 g/dL (ref 6.0–8.3)

## 2012-11-11 LAB — CBC WITH DIFFERENTIAL/PLATELET
Basophils Relative: 0.5 % (ref 0.0–3.0)
Eosinophils Relative: 2.4 % (ref 0.0–5.0)
HCT: 42.5 % (ref 36.0–46.0)
Lymphs Abs: 1.3 10*3/uL (ref 0.7–4.0)
MCV: 94.5 fl (ref 78.0–100.0)
Monocytes Absolute: 0.4 10*3/uL (ref 0.1–1.0)
Neutro Abs: 3.5 10*3/uL (ref 1.4–7.7)
Platelets: 175 10*3/uL (ref 150.0–400.0)
WBC: 5.3 10*3/uL (ref 4.5–10.5)

## 2012-11-12 LAB — VITAMIN D 25 HYDROXY (VIT D DEFICIENCY, FRACTURES): Vit D, 25-Hydroxy: 43 ng/mL (ref 30–89)

## 2012-11-17 ENCOUNTER — Encounter: Payer: Self-pay | Admitting: Family Medicine

## 2012-11-17 ENCOUNTER — Other Ambulatory Visit (HOSPITAL_COMMUNITY)
Admission: RE | Admit: 2012-11-17 | Discharge: 2012-11-17 | Disposition: A | Discharge status: Home / Self Care | Payer: Medicare Other | Source: Ambulatory Visit | Attending: Family Medicine | Admitting: Family Medicine

## 2012-11-17 ENCOUNTER — Ambulatory Visit (INDEPENDENT_AMBULATORY_CARE_PROVIDER_SITE_OTHER): Payer: Medicare Other | Admitting: Family Medicine

## 2012-11-17 VITALS — BP 128/68 | HR 71 | Temp 98.3°F | Ht 62.0 in | Wt 150.5 lb

## 2012-11-17 DIAGNOSIS — Z01419 Encounter for gynecological examination (general) (routine) without abnormal findings: Secondary | ICD-10-CM | POA: Insufficient documentation

## 2012-11-17 DIAGNOSIS — Z23 Encounter for immunization: Secondary | ICD-10-CM

## 2012-11-17 DIAGNOSIS — E7800 Pure hypercholesterolemia, unspecified: Secondary | ICD-10-CM | POA: Diagnosis not present

## 2012-11-17 DIAGNOSIS — Z1151 Encounter for screening for human papillomavirus (HPV): Secondary | ICD-10-CM | POA: Diagnosis not present

## 2012-11-17 DIAGNOSIS — M899 Disorder of bone, unspecified: Secondary | ICD-10-CM

## 2012-11-17 NOTE — Patient Instructions (Addendum)
We will refer you for a bone density test at check out  Avoid red meat/ fried foods/ egg yolks/ fatty breakfast meats/ butter, cheese and high fat dairy/ and shellfish   Schedule fasting lab in 6 months for cholesterol If you are interested in a shingles/zoster vaccine - call your insurance to check on coverage,( you should not get it within 1 month of other vaccines) , then call us for a prescription  for it to take to a pharmacy that gives the shot , or make a nurse visit to get it here depending on your coverage I highly advise a flu shot in the fall  I highly advise one pneumonia vaccine after age 30 Please work on a living will

## 2012-11-17 NOTE — Progress Notes (Signed)
Subjective:    Patient ID: Tina Mendez, female    DOB: 1945-04-29, 67 y.o.   MRN: [id number]  HPI I have personally reviewed the Medicare Annual Wellness questionnaire and have noted 1. The patient's medical and social history 2. Their use of alcohol, tobacco or illicit drugs 3. Their current medications and supplements 4. The patient's functional ability including ADL's, fall risks, home safety risks and hearing or visual             impairment. 5. Diet and physical activities 6. Evidence for depression or mood disorders  The patients weight, height, BMI have been recorded in the chart and visual acuity is per eye clinic.  I have made referrals, counseling and provided education to the patient based review of the above and I have provided the pt with a written personalized care plan for preventive services.  Has been doing fine  Nothing new medically  Is taking care of herself  Is keeping up exercise- walking and treadmill   Wt is down 2 lb - wants to loose more   See scanned forms.  Routine anticipatory guidance given to patient.  See health maintenance. Flu- does not get flu vaccines  Shingles- never had the shingles vaccine  PNA-has never had before -- and is not sure she wants one  Tetanus-- last one was in 04 - wants to get that today - even if medicare does not cover it  Colon 6/14 - was 5 year recall for polyps  Breast cancer screening- 5/14 was normal , and she saw Surgeon until last year - and then he signed off  Not seeing an oncologist  Has been cancer free for a while  Self exam -no differences or lumps   Advance directive-does not have a living will or a power of attorney  Cognitive function addressed- see scanned forms- and if abnormal then additional documentation follows. -no concerns at all   PMH and SH reviewed  Meds, vitals, and allergies reviewed.   ROS: See HPI.  Otherwise negative.    Mood - has been pretty , upbeat personality and she is is  still working part time/ stays motivated   Falls -none   Osteopenia- dexa was 07 D level is 43   Hyperlipidemia Lab Results  Component Value Date   CHOL 228* 11/11/2012   CHOL 225* 09/29/2008   CHOL 214* 06/23/2008   Lab Results  Component Value Date   HDL 59.50 11/11/2012   HDL 58.20 09/29/2008   HDL 40.98 06/23/2008   No results found for this basename: [place]   Lab Results  Component Value Date   TRIG 107.0 11/11/2012   TRIG 90.0 09/29/2008   TRIG 105.0 06/23/2008   Lab Results  Component Value Date   CHOLHDL 4 11/11/2012   CHOLHDL 4 09/29/2008   CHOLHDL 4 06/23/2008   Lab Results  Component Value Date   LDLDIRECT 153.9 11/11/2012   LDLDIRECT 152.9 09/29/2008   LDLDIRECT 138.6 06/23/2008   she does not really watch her diet for cholesterol  Wants to try changing diet and re check 6 mo   Patient Active Problem List   Diagnosis Date Noted  . Encounter for routine gynecological examination 11/17/2012  . Encounter for Medicare annual wellness exam 11/10/2012  . Folliculitis 09/26/2011  . PURE HYPERCHOLESTEROLEMIA 06/23/2008  . OSTEOPENIA 06/16/2008  . 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 Colon polyp   .  Cancer 2002    L BREAST  . History of anal fissures   . Plantar fasciitis   . Basal cell carcinoma of skin    Past Surgical History  Procedure Laterality Date  . Mastectomy  2001    LEFT  . Breast ca signs - chemo  06/2000  . Basal cell lesion    . Cesarean section  1980  . Breast cyst excision  1970    rt   History  Substance Use Topics  . Smoking status: Never Smoker   . Smokeless tobacco: Never Used  . Alcohol Use: No   Family History  Problem Relation Age of Onset  . Cancer Paternal Aunt     breast  . Diabetes Mother   . Heart disease Mother   . Cancer Paternal Uncle     Lung CA, smoker  . Colon polyps Father   . Colon cancer Neg Hx    Allergies  Allergen Reactions  . Sulfonamide  Derivatives Nausea Only   No current outpatient prescriptions on file prior to visit.   No current facility-administered medications on file prior to visit.    Review of Systems Review of Systems  Constitutional: Negative for fever, appetite change, fatigue and unexpected weight change.  Eyes: Negative for pain and visual disturbance.  Respiratory: Negative for cough and shortness of breath.   Cardiovascular: Negative for cp or palpitations    Gastrointestinal: Negative for nausea, diarrhea and constipation.  Genitourinary: Negative for urgency and frequency.  Skin: Negative for pallor or rash   Neurological: Negative for weakness, light-headedness, numbness and headaches.  Hematological: Negative for adenopathy. Does not bruise/bleed easily.  Psychiatric/Behavioral: Negative for dysphoric mood. The patient is not nervous/anxious.         Objective:   Physical Exam  Constitutional: She appears well-developed and well-nourished. No distress.  overwt and well app  HENT:  Head: Normocephalic and atraumatic.  Right Ear: External ear normal.  Left Ear: External ear normal.  Nose: Nose normal.  Mouth/Throat: Oropharynx is clear and moist.  Eyes: Conjunctivae and EOM are normal. Pupils are equal, round, and reactive to light. Right eye exhibits no discharge. Left eye exhibits no discharge. No scleral icterus.  Neck: Normal range of motion. Neck supple. No JVD present. Carotid bruit is not present. No thyromegaly present.  Cardiovascular: Normal rate, regular rhythm, normal heart sounds and intact distal pulses.  Exam reveals no gallop.   Pulmonary/Chest: Effort normal and breath sounds normal. No respiratory distress. She has no wheezes. She exhibits no tenderness.  Abdominal: Soft. Bowel sounds are normal. She exhibits no distension, no abdominal bruit and no mass. There is no tenderness.  Genitourinary: Vagina normal and uterus normal. No breast swelling, tenderness, discharge or  bleeding. No vaginal discharge found.  o External genitalia  o Urethral meatus nl appearing  o Urethra  Nl and mobile o Bladder nt and nl by palpation o Vagina mucosa slt atrophic o Cervix  Posterior and nl , no d/c or bleeding  o Uterus  Not enl or fixed  o Adnexa/parametria nl  o Anus and perineum nl appearing   Breast exam  L side- mastectomy- healed/ no masses  R Breast exam: No mass, nodules, thickening, tenderness, bulging, retraction, inflamation, nipple discharge or skin changes noted.  No axillary or clavicular LA.  Chaperoned exam.    Musculoskeletal: She exhibits no edema and no tenderness.  Lymphadenopathy:    She has no cervical adenopathy.  Neurological: She is alert. She  has normal reflexes. No cranial nerve deficit. She exhibits normal muscle tone. Coordination normal.  Skin: Skin is warm and dry. No rash noted. No erythema. No pallor.  Psychiatric: She has a normal mood and affect.          Assessment & Plan:  .

## 2012-11-18 NOTE — Assessment & Plan Note (Signed)
Disc goals for lipids and reasons to control them Rev labs with pt Rev low sat fat diet in detail Pt will work on diet and then re check 3-6 mo

## 2012-11-18 NOTE — Assessment & Plan Note (Signed)
Exam done No concerns  Pap obt

## 2012-11-18 NOTE — Assessment & Plan Note (Signed)
Schedule dexa Has been tx for breast ca in past No falls or fractures

## 2012-11-22 ENCOUNTER — Encounter: Payer: Self-pay | Admitting: *Deleted

## 2012-11-30 ENCOUNTER — Ambulatory Visit (INDEPENDENT_AMBULATORY_CARE_PROVIDER_SITE_OTHER)
Admission: RE | Admit: 2012-11-30 | Discharge: 2012-11-30 | Disposition: A | Discharge status: Home / Self Care | Payer: Medicare Other | Source: Ambulatory Visit | Attending: Family Medicine | Admitting: Family Medicine

## 2012-11-30 DIAGNOSIS — M899 Disorder of bone, unspecified: Secondary | ICD-10-CM

## 2012-11-30 LAB — HM DEXA SCAN

## 2012-12-14 ENCOUNTER — Encounter: Payer: Self-pay | Admitting: *Deleted

## 2012-12-14 ENCOUNTER — Encounter: Payer: Self-pay | Admitting: Family Medicine

## 2013-01-10 DIAGNOSIS — D239 Other benign neoplasm of skin, unspecified: Secondary | ICD-10-CM | POA: Diagnosis not present

## 2013-01-10 DIAGNOSIS — Z85828 Personal history of other malignant neoplasm of skin: Secondary | ICD-10-CM | POA: Diagnosis not present

## 2013-01-10 DIAGNOSIS — L821 Other seborrheic keratosis: Secondary | ICD-10-CM | POA: Diagnosis not present

## 2013-01-10 DIAGNOSIS — L578 Other skin changes due to chronic exposure to nonionizing radiation: Secondary | ICD-10-CM | POA: Diagnosis not present

## 2013-01-10 DIAGNOSIS — D485 Neoplasm of uncertain behavior of skin: Secondary | ICD-10-CM | POA: Diagnosis not present

## 2013-01-10 DIAGNOSIS — C44711 Basal cell carcinoma of skin of unspecified lower limb, including hip: Secondary | ICD-10-CM | POA: Diagnosis not present

## 2013-01-10 DIAGNOSIS — L708 Other acne: Secondary | ICD-10-CM | POA: Diagnosis not present

## 2013-01-10 DIAGNOSIS — D1800 Hemangioma unspecified site: Secondary | ICD-10-CM | POA: Diagnosis not present

## 2013-02-07 DIAGNOSIS — L578 Other skin changes due to chronic exposure to nonionizing radiation: Secondary | ICD-10-CM | POA: Diagnosis not present

## 2013-02-07 DIAGNOSIS — C44711 Basal cell carcinoma of skin of unspecified lower limb, including hip: Secondary | ICD-10-CM | POA: Diagnosis not present

## 2013-03-11 ENCOUNTER — Encounter: Payer: Self-pay | Admitting: Family Medicine

## 2013-03-11 ENCOUNTER — Ambulatory Visit (INDEPENDENT_AMBULATORY_CARE_PROVIDER_SITE_OTHER): Payer: Medicare Other | Admitting: Family Medicine

## 2013-03-11 VITALS — BP 118/64 | HR 76 | Temp 97.9°F | Ht 63.0 in | Wt 147.8 lb

## 2013-03-11 DIAGNOSIS — J069 Acute upper respiratory infection, unspecified: Secondary | ICD-10-CM | POA: Diagnosis not present

## 2013-03-11 MED ORDER — ALBUTEROL SULFATE HFA 108 (90 BASE) MCG/ACT IN AERS: 2.0000 | INHALATION_SPRAY | RESPIRATORY_TRACT | Status: DC | PRN

## 2013-03-11 MED ORDER — HYDROCODONE-HOMATROPINE 5-1.5 MG/5ML PO SYRP: 5.0000 mL | ORAL_SOLUTION | Freq: Every evening | ORAL | Status: DC | PRN

## 2013-03-11 MED ORDER — AZITHROMYCIN 250 MG PO TABS: ORAL_TABLET | ORAL | Status: DC

## 2013-03-11 NOTE — Patient Instructions (Addendum)
I think you have a head and chest cold/ viral  Fluids/ rest During the day try mucinex DM for congested cough  At night - hycodan - cough syrup will make you sleepy Fill albuterol inhaler if needed for wheezing  If not improved or worsening - ie: fever/ worse cough- fill the zithromax px     Upper Respiratory Infection, Adult An upper respiratory infection (URI) is also sometimes known as the common cold. The upper respiratory tract includes the nose, sinuses, throat, trachea, and bronchi. Bronchi are the airways leading to the lungs. Most people improve within 1 week, but symptoms can last up to 2 weeks. A residual cough may last even longer.  CAUSES Many different viruses can infect the tissues lining the upper respiratory tract. The tissues become irritated and inflamed and often become very moist. Mucus production is also common. A cold is contagious. You can easily spread the virus to others by oral contact. This includes kissing, sharing a glass, coughing, or sneezing. Touching your mouth or nose and then touching a surface, which is then touched by another person, can also spread the virus. SYMPTOMS  Symptoms typically develop 1 to 3 days after you come in contact with a cold virus. Symptoms vary from person to person. They may include:  Runny nose.  Sneezing.  Nasal congestion.  Sinus irritation.  Sore throat.  Loss of voice (laryngitis).  Cough.  Fatigue.  Muscle aches.  Loss of appetite.  Headache.  Low-grade fever. DIAGNOSIS  You might diagnose your own cold based on familiar symptoms, since most people get a cold 2 to 3 times a year. Your caregiver can confirm this based on your exam. Most importantly, your caregiver can check that your symptoms are not due to another disease such as strep throat, sinusitis, pneumonia, asthma, or epiglottitis. Blood tests, throat tests, and X-rays are not necessary to diagnose a common cold, but they may sometimes be helpful in  excluding other more serious diseases. Your caregiver will decide if any further tests are required. RISKS AND COMPLICATIONS  You may be at risk for a more severe case of the common cold if you smoke cigarettes, have chronic heart disease (such as heart failure) or lung disease (such as asthma), or if you have a weakened immune system. The very young and very old are also at risk for more serious infections. Bacterial sinusitis, middle ear infections, and bacterial pneumonia can complicate the common cold. The common cold can worsen asthma and chronic obstructive pulmonary disease (COPD). Sometimes, these complications can require emergency medical care and may be life-threatening. PREVENTION  The best way to protect against getting a cold is to practice good hygiene. Avoid oral or hand contact with people with cold symptoms. Wash your hands often if contact occurs. There is no clear evidence that vitamin C, vitamin E, echinacea, or exercise reduces the chance of developing a cold. However, it is always recommended to get plenty of rest and practice good nutrition. TREATMENT  Treatment is directed at relieving symptoms. There is no cure. Antibiotics are not effective, because the infection is caused by a virus, not by bacteria. Treatment may include:  Increased fluid intake. Sports drinks offer valuable electrolytes, sugars, and fluids.  Breathing heated mist or steam (vaporizer or shower).  Eating chicken soup or other clear broths, and maintaining good nutrition.  Getting plenty of rest.  Using gargles or lozenges for comfort.  Controlling fevers with ibuprofen or acetaminophen as directed by your caregiver.  Increasing usage of your inhaler if you have asthma. Zinc gel and zinc lozenges, taken in the first 24 hours of the common cold, can shorten the duration and lessen the severity of symptoms. Pain medicines may help with fever, muscle aches, and throat pain. A variety of non-prescription  medicines are available to treat congestion and runny nose. Your caregiver can make recommendations and may suggest nasal or lung inhalers for other symptoms.  HOME CARE INSTRUCTIONS   Only take over-the-counter or prescription medicines for pain, discomfort, or fever as directed by your caregiver.  Use a warm mist humidifier or inhale steam from a shower to increase air moisture. This may keep secretions moist and make it easier to breathe.  Drink enough water and fluids to keep your urine clear or pale yellow.  Rest as needed.  Return to work when your temperature has returned to normal or as your caregiver advises. You may need to stay home longer to avoid infecting others. You can also use a face mask and careful hand washing to prevent spread of the virus. SEEK MEDICAL CARE IF:   After the first few days, you feel you are getting worse rather than better.  You need your caregiver's advice about medicines to control symptoms.  You develop chills, worsening shortness of breath, or brown or red sputum. These may be signs of pneumonia.  You develop yellow or brown nasal discharge or pain in the face, especially when you bend forward. These may be signs of sinusitis.  You develop a fever, swollen neck glands, pain with swallowing, or white areas in the back of your throat. These may be signs of strep throat. SEEK IMMEDIATE MEDICAL CARE IF:   You have a fever.  You develop severe or persistent headache, ear pain, sinus pain, or chest pain.  You develop wheezing, a prolonged cough, cough up blood, or have a change in your usual mucus (if you have chronic lung disease).  You develop sore muscles or a stiff neck. Document Released: 09/17/2000 Document Revised: 06/16/2011 Document Reviewed: 07/26/2010 [facility] Patient Information 2014 [place], [place].

## 2013-03-11 NOTE — Progress Notes (Signed)
Pre-visit discussion using our clinic review tool. No additional management support is needed unless otherwise documented below in the visit note.  

## 2013-03-11 NOTE — Progress Notes (Signed)
Subjective:    Patient ID: Tina Mendez, female    DOB: [birth year], 67 y.o.   MRN: [id number]  HPI Here with uri symptoms Has had it for a week Just not getting better  Cough- with a bit of wheezing at night   (has chills at night - even though she does not have a fever)   Some congestion in throat and nose  No ST or painful ears   Feels run down   otc - tried some mucinex and ibuprofen   Patient Active Problem List   Diagnosis Date Noted  . Encounter for routine gynecological examination 11/17/2012  . Encounter for Medicare annual wellness exam 11/10/2012  . Folliculitis 09/26/2011  . PURE HYPERCHOLESTEROLEMIA 06/23/2008  . OSTEOPENIA 06/16/2008  . 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 Colon polyp   . Cancer 2002    L BREAST  . History of anal fissures   . Plantar fasciitis   . Basal cell carcinoma of skin    Past Surgical History  Procedure Laterality Date  . Mastectomy  2001    LEFT  . Breast ca signs - chemo  06/2000  . Basal cell lesion    . Cesarean section  1980  . Breast cyst excision  1970    rt   History  Substance Use Topics  . Smoking status: Never Smoker   . Smokeless tobacco: Never Used  . Alcohol Use: No   Family History  Problem Relation Age of Onset  . Cancer Paternal Aunt     breast  . Diabetes Mother   . Heart disease Mother   . Cancer Paternal Uncle     Lung CA, smoker  . Colon polyps Father   . Colon cancer Neg Hx    Allergies  Allergen Reactions  . Sulfonamide Derivatives Nausea Only   No current outpatient prescriptions on file prior to visit.   No current facility-administered medications on file prior to visit.      Review of Systems    Review of Systems  Constitutional: Negative for fever, appetite change,  and unexpected weight change.  Eyes: Negative for pain and visual disturbance.  ENt pos for congestion and rhinorrhea  Respiratory: Negative  for wheeze and shortness of breath.   Cardiovascular: Negative for cp or palpitations    Gastrointestinal: Negative for nausea, diarrhea and constipation.  Genitourinary: Negative for urgency and frequency.  Skin: Negative for pallor or rash   Neurological: Negative for weakness, light-headedness, numbness and headaches.  Hematological: Negative for adenopathy. Does not bruise/bleed easily.  Psychiatric/Behavioral: Negative for dysphoric mood. The patient is not nervous/anxious.      Objective:   Physical Exam  Constitutional: She appears well-developed and well-nourished. No distress.  HENT:  Head: Normocephalic and atraumatic.  Right Ear: External ear normal.  Left Ear: External ear normal.  Mouth/Throat: Oropharynx is clear and moist. No oropharyngeal exudate.  Nares are injected and congested  Clear rhinorrhea and drip  No sinus tenderness   Eyes: Conjunctivae and EOM are normal. Pupils are equal, round, and reactive to light. No scleral icterus.  Neck: Normal range of motion. Neck supple. No JVD present. No thyromegaly present.  Cardiovascular: Normal rate and regular rhythm.   Pulmonary/Chest: Effort normal and breath sounds normal. No respiratory distress. She has no wheezes. She has no rales.  Harsh bs No wheeze   Musculoskeletal: She exhibits no edema.  Lymphadenopathy:    She has no cervical adenopathy.  Neurological: She is alert. She has normal reflexes.  Skin: Skin is warm and dry. No rash noted.  Psychiatric: She has a normal mood and affect.          Assessment & Plan:

## 2013-03-13 NOTE — Assessment & Plan Note (Signed)
Disc symptomatic care - see instructions on AVS  Handout given  Hycodan for pm cough with caution Update if not starting to improve in a week or if worsening

## 2013-05-20 ENCOUNTER — Other Ambulatory Visit (INDEPENDENT_AMBULATORY_CARE_PROVIDER_SITE_OTHER): Payer: Medicare Other

## 2013-05-20 DIAGNOSIS — E7800 Pure hypercholesterolemia, unspecified: Secondary | ICD-10-CM | POA: Diagnosis not present

## 2013-05-20 DIAGNOSIS — M949 Disorder of cartilage, unspecified: Secondary | ICD-10-CM | POA: Diagnosis not present

## 2013-05-20 DIAGNOSIS — M899 Disorder of bone, unspecified: Secondary | ICD-10-CM | POA: Diagnosis not present

## 2013-05-20 LAB — LIPID PANEL
CHOL/HDL RATIO: 3
Cholesterol: 221 mg/dL — ABNORMAL HIGH (ref 0–200)
HDL: 66.8 mg/dL (ref >39.00)
Triglycerides: 37 mg/dL (ref 0.0–149.0)
VLDL: 7.4 mg/dL (ref 0.0–40.0)

## 2013-05-20 LAB — LDL CHOLESTEROL, DIRECT: LDL DIRECT: 145.7 mg/dL

## 2013-05-25 ENCOUNTER — Encounter: Payer: Self-pay | Admitting: *Deleted

## 2013-09-02 ENCOUNTER — Other Ambulatory Visit: Payer: Self-pay

## 2013-09-02 DIAGNOSIS — Z1231 Encounter for screening mammogram for malignant neoplasm of breast: Secondary | ICD-10-CM

## 2013-09-02 DIAGNOSIS — Z853 Personal history of malignant neoplasm of breast: Secondary | ICD-10-CM

## 2013-09-02 DIAGNOSIS — Z9012 Acquired absence of left breast and nipple: Secondary | ICD-10-CM

## 2013-09-14 ENCOUNTER — Encounter (INDEPENDENT_AMBULATORY_CARE_PROVIDER_SITE_OTHER): Payer: Self-pay

## 2013-09-14 ENCOUNTER — Ambulatory Visit
Admission: RE | Admit: 2013-09-14 | Discharge: 2013-09-14 | Disposition: A | Discharge status: Home / Self Care | Payer: Medicare Other | Source: Ambulatory Visit

## 2013-09-14 DIAGNOSIS — Z853 Personal history of malignant neoplasm of breast: Secondary | ICD-10-CM

## 2013-09-14 DIAGNOSIS — Z9012 Acquired absence of left breast and nipple: Secondary | ICD-10-CM

## 2013-09-14 DIAGNOSIS — Z1231 Encounter for screening mammogram for malignant neoplasm of breast: Secondary | ICD-10-CM

## 2013-09-15 ENCOUNTER — Telehealth: Payer: Self-pay

## 2013-09-15 ENCOUNTER — Encounter: Payer: Self-pay | Admitting: Internal Medicine

## 2013-09-15 ENCOUNTER — Ambulatory Visit (INDEPENDENT_AMBULATORY_CARE_PROVIDER_SITE_OTHER): Payer: Medicare Other | Admitting: Internal Medicine

## 2013-09-15 VITALS — BP 104/64 | HR 65 | Temp 97.7°F | Wt 142.5 lb

## 2013-09-15 DIAGNOSIS — B9689 Other specified bacterial agents as the cause of diseases classified elsewhere: Secondary | ICD-10-CM

## 2013-09-15 DIAGNOSIS — A499 Bacterial infection, unspecified: Secondary | ICD-10-CM | POA: Diagnosis not present

## 2013-09-15 DIAGNOSIS — H109 Unspecified conjunctivitis: Secondary | ICD-10-CM

## 2013-09-15 DIAGNOSIS — H1089 Other conjunctivitis: Secondary | ICD-10-CM

## 2013-09-15 MED ORDER — POLYMYXIN B-TRIMETHOPRIM 10000-0.1 UNIT/ML-% OP SOLN: 1.0000 [drp] | OPHTHALMIC | Status: DC

## 2013-09-15 NOTE — Patient Instructions (Addendum)
Bacterial Conjunctivitis  Bacterial conjunctivitis, commonly called pink eye, is an inflammation of the clear membrane that covers the white part of the eye (conjunctiva). The inflammation can also happen on the underside of the eyelids. The blood vessels in the conjunctiva become inflamed causing the eye to become red or pink. Bacterial conjunctivitis may spread easily from one eye to another and from person to person (contagious).   CAUSES   Bacterial conjunctivitis is caused by bacteria. The bacteria may come from your own skin, your upper respiratory tract, or from someone else with bacterial conjunctivitis.  SYMPTOMS   The normally white color of the eye or the underside of the eyelid is usually pink or red. The pink eye is usually associated with irritation, tearing, and some sensitivity to light. Bacterial conjunctivitis is often associated with a thick, yellowish discharge from the eye. The discharge may turn into a crust on the eyelids overnight, which causes your eyelids to stick together. If a discharge is present, there may also be some blurred vision in the affected eye.  DIAGNOSIS   Bacterial conjunctivitis is diagnosed by your caregiver through an eye exam and the symptoms that you report. Your caregiver looks for changes in the surface tissues of your eyes, which may point to the specific type of conjunctivitis. A sample of any discharge may be collected on a cotton-tip swab if you have a severe case of conjunctivitis, if your cornea is affected, or if you keep getting repeat infections that do not respond to treatment. The sample will be sent to a lab to see if the inflammation is caused by a bacterial infection and to see if the infection will respond to antibiotic medicines.  TREATMENT   · Bacterial conjunctivitis is treated with antibiotics. Antibiotic eyedrops are most often used. However, antibiotic ointments are also available. Antibiotics pills are sometimes used. Artificial tears or eye  washes may ease discomfort.  HOME CARE INSTRUCTIONS   · To ease discomfort, apply a cool, clean wash cloth to your eye for 10 20 minutes, 3 4 times a day.  · Gently wipe away any drainage from your eye with a warm, wet washcloth or a cotton ball.  · Wash your hands often with soap and water. Use paper towels to dry your hands.  · Do not share towels or wash cloths. This may spread the infection.  · Change or wash your pillow case every day.  · You should not use eye makeup until the infection is gone.  · Do not operate machinery or drive if your vision is blurred.  · Stop using contacts lenses. Ask your caregiver how to sterilize or replace your contacts before using them again. This depends on the type of contact lenses that you use.  · When applying medicine to the infected eye, do not touch the edge of your eyelid with the eyedrop bottle or ointment tube.  SEEK IMMEDIATE MEDICAL CARE IF:   · Your infection has not improved within 3 days after beginning treatment.  · You had yellow discharge from your eye and it returns.  · You have increased eye pain.  · Your eye redness is spreading.  · Your vision becomes blurred.  · You have a fever or persistent symptoms for more than 2 3 days.  · You have a fever and your symptoms suddenly get worse.  · You have facial pain, redness, or swelling.  MAKE SURE YOU:   · Understand these instructions.  · Will watch your   condition.  · Will get help right away if you are not doing well or get worse.  Document Released: 03/24/2005 Document Revised: 12/17/2011 Document Reviewed: 08/25/2011  [employer]® Patient Information ©2014 [employer].

## 2013-09-15 NOTE — Telephone Encounter (Signed)
Brooke with [facility] request how long is pt to use Polytrim eye drops; advised Brooke from office note 5-7 days. Webb Silversmith NP verified that was correct.

## 2013-09-15 NOTE — Progress Notes (Signed)
Pre visit review using our clinic review tool, if applicable. No additional management support is needed unless otherwise documented below in the visit note. 

## 2013-09-15 NOTE — Progress Notes (Signed)
Subjective:    Patient ID: Tina Mendez, female    DOB: Sep 03, 1945, 68 y.o.   MRN: [id number]  HPI  Pt presents to the clinic today with c/o redness of her left eye. She noticed this yesterday. She has also noticed some yellow drainage from her eye. She denies itching, pain or fever. She has tried OTC allergy eye drops without relief. She has been in contact with her grandson who has pink eye. She does wear contacts.  Review of Systems      Past Medical History  Diagnosis Date  . Colon polyp   . Cancer 2002    L BREAST  . History of anal fissures   . Plantar fasciitis   . Basal cell carcinoma of skin     Current Outpatient Prescriptions  Medication Sig Dispense Refill  . albuterol (PROVENTIL HFA;VENTOLIN HFA) 108 (90 BASE) MCG/ACT inhaler Inhale 2 puffs into the lungs every 4 (four) hours as needed for wheezing or shortness of breath.  1 Inhaler  0  . HYDROcodone-homatropine (HYCODAN) 5-1.5 MG/5ML syrup Take 5 mLs by mouth at bedtime as needed for cough (watch out for sedation).  120 mL  0   No current facility-administered medications for this visit.    Allergies  Allergen Reactions  . Sulfonamide Derivatives Nausea Only    Family History  Problem Relation Age of Onset  . Cancer Paternal Aunt     breast  . Diabetes Mother   . Heart disease Mother   . Cancer Paternal Uncle     Lung CA, smoker  . Colon polyps Father   . Colon cancer Neg Hx     History   Social History  . Marital Status: Married    Spouse Name: N/A    Number of Children: N/A  . Years of Education: N/A   Occupational History  . [employer]    Social History Main Topics  . Smoking status: Never Smoker   . Smokeless tobacco: Never Used  . Alcohol Use: No  . Drug Use: No  . Sexual Activity: Not on file   Other Topics Concern  . Not on file   Social History Narrative   Regular exercise:  No     Constitutional: Denies fever, malaise, fatigue, headache or abrupt weight  changes.  HEENT: Pt reports eye redness. Denies eye pain, ear pain, ringing in the ears, wax buildup, runny nose, nasal congestion, bloody nose, or sore throat.    No other specific complaints in a complete review of systems (except as listed in HPI above).  Objective:   Physical Exam   BP 104/64  Pulse 65  Temp(Src) 97.7 F (36.5 C) (Oral)  Wt 142 lb 8 oz (64.638 kg)  SpO2 98% Wt Readings from Last 3 Encounters:  09/15/13 142 lb 8 oz (64.638 kg)  03/11/13 147 lb 12 oz (67.019 kg)  11/17/12 150 lb 8 oz (68.266 kg)    General: Appears her stated age, well developed, well nourished in NAD. Skin: Warm, dry and intact. No rashes, lesions or ulcerations noted. HEENT: Eyes: sclera injected, no icterus, conjunctiva erythematous, PERRLA and EOMs intact;   Cardiovascular: Normal rate and rhythm. S1,S2 noted.  No murmur, rubs or gallops noted. No JVD or BLE edema. No carotid bruits noted. Pulmonary/Chest: Normal effort and positive vesicular breath sounds. No respiratory distress. No wheezes, rales or ronchi noted.    BMET    Component Value Date/Time   NA 141 11/11/2012 2263  K 5.0 11/11/2012 0821   CL 107 11/11/2012 0821   CO2 29 11/11/2012 0821   GLUCOSE 98 11/11/2012 0821   BUN 15 11/11/2012 0821   CREATININE 0.8 11/11/2012 0821   CALCIUM 9.7 11/11/2012 0821   GFRNONAA 90.19 06/23/2008 1032    Lipid Panel     Component Value Date/Time   CHOL 221* 05/20/2013 0839   TRIG 37.0 05/20/2013 0839   HDL 66.80 05/20/2013 0839   CHOLHDL 3 05/20/2013 0839   VLDL 7.4 05/20/2013 0839    CBC    Component Value Date/Time   WBC 5.3 11/11/2012 0821   WBC 6.5 09/03/2007 0914   RBC 4.49 11/11/2012 0821   RBC 4.55 09/03/2007 0914   HGB 14.3 11/11/2012 0821   HGB 14.6 09/03/2007 0914   HCT 42.5 11/11/2012 0821   HCT 42.0 09/03/2007 0914   PLT 175.0 11/11/2012 0821   PLT 182 09/03/2007 0914   MCV 94.5 11/11/2012 0821   MCV 92.3 09/03/2007 0914   MCH 32.0 09/03/2007 0914   MCHC 33.7 11/11/2012 0821   MCHC 34.7  09/03/2007 0914   RDW 12.5 11/11/2012 0821   RDW 12.2 09/03/2007 0914   LYMPHSABS 1.3 11/11/2012 0821   LYMPHSABS 1.3 09/03/2007 0914   MONOABS 0.4 11/11/2012 0821   MONOABS 0.4 09/03/2007 0914   EOSABS 0.1 11/11/2012 0821   EOSABS 0.1 09/03/2007 0914   BASOSABS 0.0 11/11/2012 0821   BASOSABS 0.0 09/03/2007 0914    Hgb A1C No results found for this basename: HGBA1C        Assessment & Plan:   Bacterial Conjunctivitis:  Septra Eye Drops 5-7 days Warm compresses for comfort Ibuprofen for pain/inflammation  RTC as needed or if symptoms persist or worsen

## 2013-09-16 ENCOUNTER — Encounter: Payer: Self-pay | Admitting: Family Medicine

## 2014-02-13 DIAGNOSIS — Z1283 Encounter for screening for malignant neoplasm of skin: Secondary | ICD-10-CM | POA: Diagnosis not present

## 2014-02-13 DIAGNOSIS — L814 Other melanin hyperpigmentation: Secondary | ICD-10-CM | POA: Diagnosis not present

## 2014-02-13 DIAGNOSIS — D229 Melanocytic nevi, unspecified: Secondary | ICD-10-CM | POA: Diagnosis not present

## 2014-02-13 DIAGNOSIS — L578 Other skin changes due to chronic exposure to nonionizing radiation: Secondary | ICD-10-CM | POA: Diagnosis not present

## 2014-02-13 DIAGNOSIS — L820 Inflamed seborrheic keratosis: Secondary | ICD-10-CM | POA: Diagnosis not present

## 2014-02-13 DIAGNOSIS — Z85828 Personal history of other malignant neoplasm of skin: Secondary | ICD-10-CM | POA: Diagnosis not present

## 2014-02-13 DIAGNOSIS — D1800 Hemangioma unspecified site: Secondary | ICD-10-CM | POA: Diagnosis not present

## 2014-02-13 DIAGNOSIS — L821 Other seborrheic keratosis: Secondary | ICD-10-CM | POA: Diagnosis not present

## 2014-05-17 ENCOUNTER — Telehealth: Payer: Self-pay | Admitting: Family Medicine

## 2014-05-17 DIAGNOSIS — Z0000 Encounter for general adult medical examination without abnormal findings: Secondary | ICD-10-CM

## 2014-05-17 DIAGNOSIS — E7800 Pure hypercholesterolemia, unspecified: Secondary | ICD-10-CM

## 2014-05-17 DIAGNOSIS — M8580 Other specified disorders of bone density and structure, unspecified site: Secondary | ICD-10-CM

## 2014-05-17 NOTE — Telephone Encounter (Signed)
-----   Message from Ellamae Sia sent at 05/16/2014 10:51 AM EST ----- Regarding: Lab orders for Friday, 2.12.16 Patient is scheduled for CPX labs, please order future labs, Thanks , Karna Christmas

## 2014-05-19 ENCOUNTER — Other Ambulatory Visit (INDEPENDENT_AMBULATORY_CARE_PROVIDER_SITE_OTHER): Payer: Medicare Other

## 2014-05-19 DIAGNOSIS — M8580 Other specified disorders of bone density and structure, unspecified site: Secondary | ICD-10-CM | POA: Diagnosis not present

## 2014-05-19 DIAGNOSIS — Z0000 Encounter for general adult medical examination without abnormal findings: Secondary | ICD-10-CM

## 2014-05-19 DIAGNOSIS — E7800 Pure hypercholesterolemia, unspecified: Secondary | ICD-10-CM

## 2014-05-19 LAB — CBC WITH DIFFERENTIAL/PLATELET
Basophils Absolute: 0 10*3/uL (ref 0.0–0.1)
Basophils Relative: 0.6 % (ref 0.0–3.0)
Eosinophils Absolute: 0.1 10*3/uL (ref 0.0–0.7)
Eosinophils Relative: 2.1 % (ref 0.0–5.0)
HCT: 41.2 % (ref 36.0–46.0)
Hemoglobin: 13.9 g/dL (ref 12.0–15.0)
Lymphocytes Relative: 28.6 % (ref 12.0–46.0)
Lymphs Abs: 1.4 10*3/uL (ref 0.7–4.0)
MCHC: 33.9 g/dL (ref 30.0–36.0)
MCV: 93.7 fl (ref 78.0–100.0)
MONO ABS: 0.3 10*3/uL (ref 0.1–1.0)
Monocytes Relative: 6.3 % (ref 3.0–12.0)
NEUTROS ABS: 3.1 10*3/uL (ref 1.4–7.7)
Neutrophils Relative %: 62.4 % (ref 43.0–77.0)
PLATELETS: 169 10*3/uL (ref 150.0–400.0)
RBC: 4.39 Mil/uL (ref 3.87–5.11)
RDW: 13.1 % (ref 11.5–15.5)
WBC: 5 10*3/uL (ref 4.0–10.5)

## 2014-05-19 LAB — LIPID PANEL
Cholesterol: 212 mg/dL — ABNORMAL HIGH (ref 0–200)
HDL: 69.9 mg/dL (ref >39.00)
LDL CALC: 123 mg/dL — AB (ref 0–99)
NonHDL: 142.1
TRIGLYCERIDES: 97 mg/dL (ref 0.0–149.0)
Total CHOL/HDL Ratio: 3
VLDL: 19.4 mg/dL (ref 0.0–40.0)

## 2014-05-19 LAB — COMPREHENSIVE METABOLIC PANEL
ALT: 18 U/L (ref 0–35)
AST: 20 U/L (ref 0–37)
Albumin: 4.1 g/dL (ref 3.5–5.2)
Alkaline Phosphatase: 76 U/L (ref 39–117)
BILIRUBIN TOTAL: 0.7 mg/dL (ref 0.2–1.2)
BUN: 18 mg/dL (ref 6–23)
CHLORIDE: 107 meq/L (ref 96–112)
CO2: 30 meq/L (ref 19–32)
Calcium: 9.4 mg/dL (ref 8.4–10.5)
Creatinine, Ser: 0.63 mg/dL (ref 0.40–1.20)
GFR: 99.98 mL/min (ref >60.00)
Glucose, Bld: 95 mg/dL (ref 70–99)
Potassium: 4.7 mEq/L (ref 3.5–5.1)
SODIUM: 140 meq/L (ref 135–145)
TOTAL PROTEIN: 6.9 g/dL (ref 6.0–8.3)

## 2014-05-19 LAB — VITAMIN D 25 HYDROXY (VIT D DEFICIENCY, FRACTURES): VITD: 21 ng/mL — ABNORMAL LOW (ref 30.00–100.00)

## 2014-05-19 LAB — TSH: TSH: 1.77 u[IU]/mL (ref 0.35–4.50)

## 2014-05-29 ENCOUNTER — Ambulatory Visit (INDEPENDENT_AMBULATORY_CARE_PROVIDER_SITE_OTHER): Payer: Medicare Other | Admitting: Family Medicine

## 2014-05-29 ENCOUNTER — Encounter: Payer: Self-pay | Admitting: Family Medicine

## 2014-05-29 VITALS — BP 110/68 | HR 74 | Temp 97.3°F | Ht 63.0 in | Wt 144.8 lb

## 2014-05-29 DIAGNOSIS — E559 Vitamin D deficiency, unspecified: Secondary | ICD-10-CM

## 2014-05-29 DIAGNOSIS — E7800 Pure hypercholesterolemia, unspecified: Secondary | ICD-10-CM

## 2014-05-29 DIAGNOSIS — M8580 Other specified disorders of bone density and structure, unspecified site: Secondary | ICD-10-CM

## 2014-05-29 DIAGNOSIS — Z23 Encounter for immunization: Secondary | ICD-10-CM

## 2014-05-29 DIAGNOSIS — Z0000 Encounter for general adult medical examination without abnormal findings: Secondary | ICD-10-CM

## 2014-05-29 NOTE — Progress Notes (Signed)
Subjective:    Patient ID: Tina Mendez, female    DOB: 1946/02/06, 69 y.o.   MRN: [id number]  HPI Here for annual medicare wellness visit and also acute/chronic health problems  Wt is up 2 lb with bmi of 25  I have personally reviewed the Medicare Annual Wellness questionnaire and have noted 1. The patient's medical and social history 2. Their use of alcohol, tobacco or illicit drugs 3. Their current medications and supplements 4. The patient's functional ability including ADL's, fall risks, home safety risks and hearing or visual             impairment. 5. Diet and physical activities 6. Evidence for depression or mood disorders  The patients weight, height, BMI have been recorded in the chart and visual acuity is per eye clinic.  I have made referrals, counseling and provided education to the patient based review of the above and I have provided the pt with a written personalized care plan for preventive services.  Has been feeling ok   See scanned forms.  Routine anticipatory guidance given to patient.  See health maintenance. Colon cancer screening 6/14 with 5 y recall  Breast cancer screening unilat mammogram R breast neg 6/15 -no new developments with breast cancer  Self breast exam-no lumps  Flu vaccine- did not get flu shot this season  Tetanus vaccine 8/14  Pneumovax needs vaccine - will do that today  Zoster vaccine - not interested today/ may be in the future  dexa 8/14 osteopenia  Advance directive - unsure if she has a living will / if she does needs update - given materials  Cognitive function addressed- see scanned forms- and if abnormal then additional documentation follows.  Nothing concerning   PMH and SH reviewed  Meds, vitals, and allergies reviewed.   ROS: See HPI.  Otherwise negative.     dexa was 8/14 - due in aug 2016 year  Last time she was here she stopped vit D  D level is 21  No fractures or falls   Cholesterol Lab Results  Component  Value Date   CHOL 212* 05/19/2014   CHOL 221* 05/20/2013   CHOL 228* 11/11/2012   Lab Results  Component Value Date   HDL 69.90 05/19/2014   HDL 66.80 05/20/2013   HDL 59.50 11/11/2012   Lab Results  Component Value Date   LDLCALC 123* 05/19/2014   Lab Results  Component Value Date   TRIG 97.0 05/19/2014   TRIG 37.0 05/20/2013   TRIG 107.0 11/11/2012   Lab Results  Component Value Date   CHOLHDL 3 05/19/2014   CHOLHDL 3 05/20/2013   CHOLHDL 4 11/11/2012   Lab Results  Component Value Date   LDLDIRECT 145.7 05/20/2013   LDLDIRECT 153.9 11/11/2012   LDLDIRECT 152.9 09/29/2008  improved from last check  LDL is down and HDL is excellent  Diet is fair - she has been eating more protein since her husband had wt loss surgery  No fried foods    Results for orders placed or performed in visit on 05/19/14  Comprehensive metabolic panel  Result Value Ref Range   Sodium 140 135 - 145 mEq/L   Potassium 4.7 3.5 - 5.1 mEq/L   Chloride 107 96 - 112 mEq/L   CO2 30 19 - 32 mEq/L   Glucose, Bld 95 70 - 99 mg/dL   BUN 18 6 - 23 mg/dL   Creatinine, Ser 0.63 0.40 - 1.20 mg/dL   Total Bilirubin  0.7 0.2 - 1.2 mg/dL   Alkaline Phosphatase 76 39 - 117 U/L   AST 20 0 - 37 U/L   ALT 18 0 - 35 U/L   Total Protein 6.9 6.0 - 8.3 g/dL   Albumin 4.1 3.5 - 5.2 g/dL   Calcium 9.4 8.4 - 10.5 mg/dL   GFR 99.98 >60.00 mL/min  Lipid panel  Result Value Ref Range   Cholesterol 212 (H) 0 - 200 mg/dL   Triglycerides 97.0 0.0 - 149.0 mg/dL   HDL 69.90 >39.00 mg/dL   VLDL 19.4 0.0 - 40.0 mg/dL   LDL Cholesterol 123 (H) 0 - 99 mg/dL   Total CHOL/HDL Ratio 3    NonHDL 142.10   TSH  Result Value Ref Range   TSH 1.77 0.35 - 4.50 uIU/mL  Vit D  25 hydroxy (rtn osteoporosis monitoring)  Result Value Ref Range   VITD 21.00 (L) 30.00 - 100.00 ng/mL  CBC with Differential/Platelet  Result Value Ref Range   WBC 5.0 4.0 - 10.5 K/uL   RBC 4.39 3.87 - 5.11 Mil/uL   Hemoglobin 13.9 12.0 - 15.0 g/dL     HCT 41.2 36.0 - 46.0 %   MCV 93.7 78.0 - 100.0 fl   MCHC 33.9 30.0 - 36.0 g/dL   RDW 13.1 11.5 - 15.5 %   Platelets 169.0 150.0 - 400.0 K/uL   Neutrophils Relative % 62.4 43.0 - 77.0 %   Lymphocytes Relative 28.6 12.0 - 46.0 %   Monocytes Relative 6.3 3.0 - 12.0 %   Eosinophils Relative 2.1 0.0 - 5.0 %   Basophils Relative 0.6 0.0 - 3.0 %   Neutro Abs 3.1 1.4 - 7.7 K/uL   Lymphs Abs 1.4 0.7 - 4.0 K/uL   Monocytes Absolute 0.3 0.1 - 1.0 K/uL   Eosinophils Absolute 0.1 0.0 - 0.7 K/uL   Basophils Absolute 0.0 0.0 - 0.1 K/uL    Rest of labs look good   Patient Active Problem List   Diagnosis Date Noted  . Viral URI with cough 03/11/2013  . Encounter for routine gynecological examination 11/17/2012  . Encounter for Medicare annual wellness exam 11/10/2012  . Folliculitis 13/24/4010  . PURE HYPERCHOLESTEROLEMIA 06/23/2008  . Osteopenia 06/16/2008  . 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 Colon polyp   . Cancer 2002    L BREAST  . History of anal fissures   . Plantar fasciitis   . Basal cell carcinoma of skin    Past Surgical History  Procedure Laterality Date  . Mastectomy  2001    LEFT  . Breast ca signs - chemo  06/2000  . Basal cell lesion    . Cesarean section  1980  . Breast cyst excision  1970    rt   History  Substance Use Topics  . Smoking status: Never Smoker   . Smokeless tobacco: Never Used  . Alcohol Use: No   Family History  Problem Relation Age of Onset  . Cancer Paternal Aunt     breast  . Diabetes Mother   . Heart disease Mother   . Cancer Paternal Uncle     Lung CA, smoker  . Colon polyps Father   . Colon cancer Neg Hx    Allergies  Allergen Reactions  . Sulfonamide Derivatives Nausea Only   No current outpatient prescriptions on file prior to visit.   No current facility-administered medications on file  prior to visit.        Review of Systems Review of Systems   Constitutional: Negative for fever, appetite change, fatigue and unexpected weight change.  Eyes: Negative for pain and visual disturbance.  Respiratory: Negative for cough and shortness of breath.   Cardiovascular: Negative for cp or palpitations    Gastrointestinal: Negative for nausea, diarrhea and constipation.  Genitourinary: Negative for urgency and frequency.  Skin: Negative for pallor or rash   Neurological: Negative for weakness, light-headedness, numbness and headaches.  Hematological: Negative for adenopathy. Does not bruise/bleed easily.  Psychiatric/Behavioral: Negative for dysphoric mood. The patient is not nervous/anxious.         Objective:   Physical Exam  Constitutional: She appears well-developed and well-nourished. No distress.  HENT:  Head: Normocephalic and atraumatic.  Right Ear: External ear normal.  Left Ear: External ear normal.  Nose: Nose normal.  Mouth/Throat: Oropharynx is clear and moist.  Eyes: Conjunctivae and EOM are normal. Pupils are equal, round, and reactive to light. Right eye exhibits no discharge. Left eye exhibits no discharge. No scleral icterus.  Neck: Normal range of motion. Neck supple. No JVD present. No thyromegaly present.  Cardiovascular: Normal rate, regular rhythm, normal heart sounds and intact distal pulses.  Exam reveals no gallop.   Pulmonary/Chest: Effort normal and breath sounds normal. No respiratory distress. She has no wheezes. She has no rales.  Abdominal: Soft. Bowel sounds are normal. She exhibits no distension and no mass. There is no tenderness.  Genitourinary:  R breast (Breast exam: No mass, nodules, thickening, tenderness, bulging, retraction, inflamation, nipple discharge or skin changes noted.  No axillary or clavicular LA. ) L mastectomy site is nt and without masses or changes     Musculoskeletal: She exhibits no edema or tenderness.  Lymphadenopathy:    She has no cervical adenopathy.  Neurological: She is  alert. She has normal reflexes. No cranial nerve deficit. She exhibits normal muscle tone. Coordination normal.  Skin: Skin is warm and dry. No rash noted. No erythema. No pallor.  Psychiatric: She has a normal mood and affect.          Assessment & Plan:   Problem List Items Addressed This Visit      Musculoskeletal and Integument   Osteopenia    Due for dexa in Aug Rev prev one Disc need for calcium/ vitamin D/ wt bearing exercise and bone density test every 2 y to monitor Disc safety/ fracture risk in detail   D is low- will begin 2000 iu daily           Other   Encounter for Medicare annual wellness exam - Primary    Reviewed health habits including diet and exercise and skin cancer prevention Reviewed appropriate screening tests for age  Also reviewed health mt list, fam hx and immunization status , as well as social and family history   Pneumovax today Declines flu shot at this time Declines zoster- may get in the future-will check coverage Given materials to work on adv dir- she will do that Labs reviewed       PURE HYPERCHOLESTEROLEMIA    Disc goals for lipids and reasons to control them Rev labs with pt Rev low sat fat diet in detail  Overall improved from last check -commended       Vitamin D deficiency    D level is 21  Disc supplementation for bone and overall health Will start 2000 iu daily otc  Re check  in a yea r Due for dexa in Aug

## 2014-05-29 NOTE — Assessment & Plan Note (Signed)
Disc goals for lipids and reasons to control them Rev labs with pt Rev low sat fat diet in detail  Overall improved from last check -commended

## 2014-05-29 NOTE — Patient Instructions (Signed)
Pneumonia vaccine today  If you are interested in a shingles/zoster vaccine - call your insurance to check on coverage,( you should not get it within 1 month of other vaccines) , then call us for a prescription  for it to take to a pharmacy that gives the shot , or make a nurse visit to get it here depending on your coverage Please work on an advanced directive (I gave you the blue booklet)  Your vitamin D level is low - take 2000 iu of vitamin D3 daily over the counter (all year long) Take care of yourself

## 2014-05-29 NOTE — Assessment & Plan Note (Signed)
D level is 21  Disc supplementation for bone and overall health Will start 2000 iu daily otc  Re check in a yea r Due for dexa in Aug

## 2014-05-29 NOTE — Progress Notes (Signed)
Pre visit review using our clinic review tool, if applicable. No additional management support is needed unless otherwise documented below in the visit note. 

## 2014-05-29 NOTE — Assessment & Plan Note (Signed)
Due for dexa in Aug Rev prev one Disc need for calcium/ vitamin D/ wt bearing exercise and bone density test every 2 y to monitor Disc safety/ fracture risk in detail   D is low- will begin 2000 iu daily

## 2014-05-29 NOTE — Assessment & Plan Note (Signed)
Reviewed health habits including diet and exercise and skin cancer prevention Reviewed appropriate screening tests for age  Also reviewed health mt list, fam hx and immunization status , as well as social and family history   Pneumovax today Declines flu shot at this time Declines zoster- may get in the future-will check coverage Given materials to work on adv dir- she will do that Labs reviewed

## 2014-08-29 ENCOUNTER — Other Ambulatory Visit: Payer: Self-pay

## 2014-08-29 DIAGNOSIS — Z1231 Encounter for screening mammogram for malignant neoplasm of breast: Secondary | ICD-10-CM

## 2014-09-20 ENCOUNTER — Ambulatory Visit: Payer: Medicare Other

## 2014-09-27 ENCOUNTER — Ambulatory Visit: Payer: Medicare Other

## 2014-09-28 ENCOUNTER — Ambulatory Visit
Admission: RE | Admit: 2014-09-28 | Discharge: 2014-09-28 | Disposition: A | Discharge status: Home / Self Care | Payer: Medicare Other | Source: Ambulatory Visit

## 2014-09-28 DIAGNOSIS — Z1231 Encounter for screening mammogram for malignant neoplasm of breast: Secondary | ICD-10-CM

## 2014-09-28 LAB — HM MAMMOGRAPHY: HM Mammogram: NORMAL

## 2014-09-29 ENCOUNTER — Encounter: Payer: Self-pay | Admitting: Family Medicine

## 2014-09-29 ENCOUNTER — Encounter: Payer: Self-pay | Admitting: *Deleted

## 2015-02-15 DIAGNOSIS — L820 Inflamed seborrheic keratosis: Secondary | ICD-10-CM | POA: Diagnosis not present

## 2015-02-15 DIAGNOSIS — D229 Melanocytic nevi, unspecified: Secondary | ICD-10-CM | POA: Diagnosis not present

## 2015-02-15 DIAGNOSIS — L578 Other skin changes due to chronic exposure to nonionizing radiation: Secondary | ICD-10-CM | POA: Diagnosis not present

## 2015-02-15 DIAGNOSIS — L570 Actinic keratosis: Secondary | ICD-10-CM | POA: Diagnosis not present

## 2015-02-15 DIAGNOSIS — Z85828 Personal history of other malignant neoplasm of skin: Secondary | ICD-10-CM | POA: Diagnosis not present

## 2015-02-15 DIAGNOSIS — Z1283 Encounter for screening for malignant neoplasm of skin: Secondary | ICD-10-CM | POA: Diagnosis not present

## 2015-02-15 DIAGNOSIS — D1809 Hemangioma of other sites: Secondary | ICD-10-CM | POA: Diagnosis not present

## 2015-02-15 DIAGNOSIS — L812 Freckles: Secondary | ICD-10-CM | POA: Diagnosis not present

## 2015-02-15 DIAGNOSIS — C44712 Basal cell carcinoma of skin of right lower limb, including hip: Secondary | ICD-10-CM | POA: Diagnosis not present

## 2015-02-15 DIAGNOSIS — D0471 Carcinoma in situ of skin of right lower limb, including hip: Secondary | ICD-10-CM | POA: Diagnosis not present

## 2015-02-15 DIAGNOSIS — L821 Other seborrheic keratosis: Secondary | ICD-10-CM | POA: Diagnosis not present

## 2015-02-15 DIAGNOSIS — D485 Neoplasm of uncertain behavior of skin: Secondary | ICD-10-CM | POA: Diagnosis not present

## 2015-05-08 DIAGNOSIS — C44212 Basal cell carcinoma of skin of right ear and external auricular canal: Secondary | ICD-10-CM | POA: Diagnosis not present

## 2015-05-24 ENCOUNTER — Telehealth: Payer: Self-pay | Admitting: Family Medicine

## 2015-05-24 DIAGNOSIS — E559 Vitamin D deficiency, unspecified: Secondary | ICD-10-CM

## 2015-05-24 DIAGNOSIS — E7800 Pure hypercholesterolemia, unspecified: Secondary | ICD-10-CM

## 2015-05-24 DIAGNOSIS — Z0000 Encounter for general adult medical examination without abnormal findings: Secondary | ICD-10-CM

## 2015-05-24 NOTE — Telephone Encounter (Signed)
-----   Message from Ellamae Sia sent at 05/23/2015  6:06 PM EST ----- Regarding: Lab orders for Friday, 12.17.17 Patient is scheduled for CPX labs, please order future labs, Thanks , Karna Christmas

## 2015-05-25 ENCOUNTER — Other Ambulatory Visit (INDEPENDENT_AMBULATORY_CARE_PROVIDER_SITE_OTHER): Payer: Medicare Other

## 2015-05-25 DIAGNOSIS — Z0000 Encounter for general adult medical examination without abnormal findings: Secondary | ICD-10-CM

## 2015-05-25 DIAGNOSIS — E559 Vitamin D deficiency, unspecified: Secondary | ICD-10-CM | POA: Diagnosis not present

## 2015-05-25 DIAGNOSIS — E7800 Pure hypercholesterolemia, unspecified: Secondary | ICD-10-CM

## 2015-05-25 LAB — COMPREHENSIVE METABOLIC PANEL
ALT: 12 U/L (ref 0–35)
AST: 16 U/L (ref 0–37)
Albumin: 4.5 g/dL (ref 3.5–5.2)
Alkaline Phosphatase: 72 U/L (ref 39–117)
BUN: 18 mg/dL (ref 6–23)
CALCIUM: 9.9 mg/dL (ref 8.4–10.5)
CHLORIDE: 106 meq/L (ref 96–112)
CO2: 28 meq/L (ref 19–32)
CREATININE: 0.74 mg/dL (ref 0.40–1.20)
GFR: 82.78 mL/min (ref >60.00)
GLUCOSE: 95 mg/dL (ref 70–99)
Potassium: 4.8 mEq/L (ref 3.5–5.1)
SODIUM: 141 meq/L (ref 135–145)
Total Bilirubin: 1 mg/dL (ref 0.2–1.2)
Total Protein: 7.2 g/dL (ref 6.0–8.3)

## 2015-05-25 LAB — CBC WITH DIFFERENTIAL/PLATELET
BASOS ABS: 0 10*3/uL (ref 0.0–0.1)
BASOS PCT: 0.8 % (ref 0.0–3.0)
Eosinophils Absolute: 0.1 10*3/uL (ref 0.0–0.7)
Eosinophils Relative: 2.8 % (ref 0.0–5.0)
HCT: 39.5 % (ref 36.0–46.0)
Hemoglobin: 13.4 g/dL (ref 12.0–15.0)
LYMPHS ABS: 1.2 10*3/uL (ref 0.7–4.0)
Lymphocytes Relative: 25.4 % (ref 12.0–46.0)
MCHC: 34 g/dL (ref 30.0–36.0)
MCV: 92.9 fl (ref 78.0–100.0)
Monocytes Absolute: 0.3 10*3/uL (ref 0.1–1.0)
Monocytes Relative: 6.7 % (ref 3.0–12.0)
Neutro Abs: 3 10*3/uL (ref 1.4–7.7)
Neutrophils Relative %: 64.3 % (ref 43.0–77.0)
PLATELETS: 191 10*3/uL (ref 150.0–400.0)
RBC: 4.25 Mil/uL (ref 3.87–5.11)
RDW: 13 % (ref 11.5–15.5)
WBC: 4.7 10*3/uL (ref 4.0–10.5)

## 2015-05-25 LAB — TSH: TSH: 1.61 u[IU]/mL (ref 0.35–4.50)

## 2015-05-25 LAB — LIPID PANEL
CHOL/HDL RATIO: 3
Cholesterol: 243 mg/dL — ABNORMAL HIGH (ref 0–200)
HDL: 70.8 mg/dL (ref >39.00)
LDL CALC: 157 mg/dL — AB (ref 0–99)
NONHDL: 172.36
TRIGLYCERIDES: 75 mg/dL (ref 0.0–149.0)
VLDL: 15 mg/dL (ref 0.0–40.0)

## 2015-05-25 LAB — VITAMIN D 25 HYDROXY (VIT D DEFICIENCY, FRACTURES): VITD: 27.65 ng/mL — ABNORMAL LOW (ref 30.00–100.00)

## 2015-06-01 ENCOUNTER — Ambulatory Visit (INDEPENDENT_AMBULATORY_CARE_PROVIDER_SITE_OTHER): Payer: Medicare Other | Admitting: Family Medicine

## 2015-06-01 ENCOUNTER — Encounter: Payer: Self-pay | Admitting: Family Medicine

## 2015-06-01 VITALS — BP 112/66 | HR 74 | Temp 98.1°F | Ht 62.0 in | Wt 145.5 lb

## 2015-06-01 DIAGNOSIS — E7800 Pure hypercholesterolemia, unspecified: Secondary | ICD-10-CM

## 2015-06-01 DIAGNOSIS — M8580 Other specified disorders of bone density and structure, unspecified site: Secondary | ICD-10-CM

## 2015-06-01 DIAGNOSIS — Z0000 Encounter for general adult medical examination without abnormal findings: Secondary | ICD-10-CM

## 2015-06-01 DIAGNOSIS — E559 Vitamin D deficiency, unspecified: Secondary | ICD-10-CM | POA: Diagnosis not present

## 2015-06-01 DIAGNOSIS — Z23 Encounter for immunization: Secondary | ICD-10-CM | POA: Diagnosis not present

## 2015-06-01 DIAGNOSIS — Z853 Personal history of malignant neoplasm of breast: Secondary | ICD-10-CM

## 2015-06-01 MED ORDER — CHOLECALCIFEROL 25 MCG (1000 UT) PO CHEW: 4.0000 | CHEWABLE_TABLET | Freq: Every day | ORAL | Status: DC

## 2015-06-01 NOTE — Progress Notes (Signed)
Pre visit review using our clinic review tool, if applicable. No additional management support is needed unless otherwise documented below in the visit note. 

## 2015-06-01 NOTE — Patient Instructions (Signed)
Get your mammogram in May prevnar vaccine today  If you are interested in a shingles/zoster vaccine - call your insurance to check on coverage,( you should not get it within 1 month of other vaccines) , then call us for a prescription  for it to take to a pharmacy that gives the shot , or make a nurse visit to get it here depending on your coverage For vitamin D -  I sent a px for gummies in - or you can get them over the counter  Take 4000 iu vitamin D daily  Avoid red meat/ fried foods/ egg yolks/ fatty breakfast meats/ butter, cheese and high fat dairy/ and shellfish    Take care of yourself  Keep exercising

## 2015-06-01 NOTE — Progress Notes (Signed)
Subjective:    Patient ID: Tina Mendez, female    DOB: 13-Jan-1946, 70 y.o.   MRN: [id number]  HPI Here for annual medicare wellness visit as well as chronic/acute medical problems   I have personally reviewed the Medicare Annual Wellness questionnaire and have noted 1. The patient's medical and social history 2. Their use of alcohol, tobacco or illicit drugs 3. Their current medications and supplements 4. The patient's functional ability including ADL's, fall risks, home safety risks and hearing or visual             impairment. 5. Diet and physical activities 6. Evidence for depression or mood disorders  The patients weight, height, BMI have been recorded in the chart and visual acuity is per eye clinic.  I have made referrals, counseling and provided education to the patient based review of the above and I have provided the pt with a written personalized care plan for preventive services. Reviewed and updated provider list, see scanned forms.  Feels good  Still works part time     See scanned forms.  Routine anticipatory guidance given to patient.  See health maintenance. Colon cancer screening 6/14- adenoma - 5 year recall  Breast cancer screening 6/16 -normal / with hx of prior breast cancer  Self breast exam- no lumps or change in scar  Flu vaccine - declines  Tetanus vaccine 8/14  Pneumovax 2/16 ,  Due for prevnar- will get it today Zoster vaccine-not interested right now  dexa 8/14 - is open to getting another one  Osteopenia  She does not take calcium and vitamin D hurts her stomach (gel caps)  D level is 27- low  No falls or broken bones   No vaginal bleeding  No new sexual partners  Not sexually active  No pelvic pain or vaginal symptoms  Advance directive Cognitive function addressed- see scanned forms- and if abnormal then additional documentation follows.   PMH and SH reviewed  Meds, vitals, and allergies reviewed.   ROS: See HPI.  Otherwise  negative.    Hyperlipidemia  Lab Results  Component Value Date   CHOL 243* 05/25/2015   CHOL 212* 05/19/2014   CHOL 221* 05/20/2013   Lab Results  Component Value Date   HDL 70.80 05/25/2015   HDL 69.90 05/19/2014   HDL 66.80 05/20/2013   Lab Results  Component Value Date   LDLCALC 157* 05/25/2015   LDLCALC 123* 05/19/2014   Lab Results  Component Value Date   TRIG 75.0 05/25/2015   TRIG 97.0 05/19/2014   TRIG 37.0 05/20/2013   Lab Results  Component Value Date   CHOLHDL 3 05/25/2015   CHOLHDL 3 05/19/2014   CHOLHDL 3 05/20/2013   Lab Results  Component Value Date   LDLDIRECT 145.7 05/20/2013   LDLDIRECT 153.9 11/11/2012   LDLDIRECT 152.9 09/29/2008  LDL is up significantly  She has been eating bacon or sausage every am      Results for orders placed or performed in visit on 05/25/15  CBC with Differential/Platelet  Result Value Ref Range   WBC 4.7 4.0 - 10.5 K/uL   RBC 4.25 3.87 - 5.11 Mil/uL   Hemoglobin 13.4 12.0 - 15.0 g/dL   HCT 39.5 36.0 - 46.0 %   MCV 92.9 78.0 - 100.0 fl   MCHC 34.0 30.0 - 36.0 g/dL   RDW 13.0 11.5 - 15.5 %   Platelets 191.0 150.0 - 400.0 K/uL   Neutrophils Relative % 64.3 43.0 - 77.0 %  Lymphocytes Relative 25.4 12.0 - 46.0 %   Monocytes Relative 6.7 3.0 - 12.0 %   Eosinophils Relative 2.8 0.0 - 5.0 %   Basophils Relative 0.8 0.0 - 3.0 %   Neutro Abs 3.0 1.4 - 7.7 K/uL   Lymphs Abs 1.2 0.7 - 4.0 K/uL   Monocytes Absolute 0.3 0.1 - 1.0 K/uL   Eosinophils Absolute 0.1 0.0 - 0.7 K/uL   Basophils Absolute 0.0 0.0 - 0.1 K/uL  Comprehensive metabolic panel  Result Value Ref Range   Sodium 141 135 - 145 mEq/L   Potassium 4.8 3.5 - 5.1 mEq/L   Chloride 106 96 - 112 mEq/L   CO2 28 19 - 32 mEq/L   Glucose, Bld 95 70 - 99 mg/dL   BUN 18 6 - 23 mg/dL   Creatinine, Ser 0.74 0.40 - 1.20 mg/dL   Total Bilirubin 1.0 0.2 - 1.2 mg/dL   Alkaline Phosphatase 72 39 - 117 U/L   AST 16 0 - 37 U/L   ALT 12 0 - 35 U/L   Total Protein 7.2  6.0 - 8.3 g/dL   Albumin 4.5 3.5 - 5.2 g/dL   Calcium 9.9 8.4 - 10.5 mg/dL   GFR 82.78 >60.00 mL/min  Lipid panel  Result Value Ref Range   Cholesterol 243 (H) 0 - 200 mg/dL   Triglycerides 75.0 0.0 - 149.0 mg/dL   HDL 70.80 >39.00 mg/dL   VLDL 15.0 0.0 - 40.0 mg/dL   LDL Cholesterol 157 (H) 0 - 99 mg/dL   Total CHOL/HDL Ratio 3    NonHDL 172.36   VITAMIN D 25 Hydroxy (Vit-D Deficiency, Fractures)  Result Value Ref Range   VITD 27.65 (L) 30.00 - 100.00 ng/mL  TSH  Result Value Ref Range   TSH 1.61 0.35 - 4.50 uIU/mL     Patient Active Problem List   Diagnosis Date Noted  . Vitamin D deficiency 05/29/2014  . Encounter for routine gynecological examination 11/17/2012  . Encounter for Medicare annual wellness exam 11/10/2012  . PURE HYPERCHOLESTEROLEMIA 06/23/2008  . Osteopenia 06/16/2008  . 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 Colon polyp   . Cancer ([place]) 2002    L BREAST  . History of anal fissures   . Plantar fasciitis   . Basal cell carcinoma of skin    Past Surgical History  Procedure Laterality Date  . Mastectomy  2001    LEFT  . Breast ca signs - chemo  06/2000  . Basal cell lesion    . Cesarean section  1980  . Breast cyst excision  1970    rt   Social History  Substance Use Topics  . Smoking status: Never Smoker   . Smokeless tobacco: Never Used  . Alcohol Use: No   Family History  Problem Relation Age of Onset  . Cancer Paternal Aunt     breast  . Diabetes Mother   . Heart disease Mother   . Cancer Paternal Uncle     Lung CA, smoker  . Colon polyps Father   . Colon cancer Neg Hx    Allergies  Allergen Reactions  . Sulfonamide Derivatives Nausea Only   No current outpatient prescriptions on file prior to visit.   No current facility-administered medications on file prior to visit.     Review of Systems Review of Systems  Constitutional: Negative for fever, appetite change,  fatigue and unexpected weight  change.  Eyes: Negative for pain and visual disturbance.  Respiratory: Negative for cough and shortness of breath.   Cardiovascular: Negative for cp or palpitations    Gastrointestinal: Negative for nausea, diarrhea and constipation.  Genitourinary: Negative for urgency and frequency.  Skin: Negative for pallor or rash   Neurological: Negative for weakness, light-headedness, numbness and headaches.  Hematological: Negative for adenopathy. Does not bruise/bleed easily.  Psychiatric/Behavioral: Negative for dysphoric mood. The patient is not nervous/anxious.         Objective:   Physical Exam  Constitutional: She appears well-developed and well-nourished. No distress.  overwt and well app   HENT:  Head: Normocephalic and atraumatic.  Right Ear: External ear normal.  Left Ear: External ear normal.  Mouth/Throat: Oropharynx is clear and moist.  Eyes: Conjunctivae and EOM are normal. Pupils are equal, round, and reactive to light. No scleral icterus.  Neck: Normal range of motion. Neck supple. No JVD present. Carotid bruit is not present. No thyromegaly present.  Cardiovascular: Normal rate, regular rhythm, normal heart sounds and intact distal pulses.  Exam reveals no gallop.   Pulmonary/Chest: Effort normal and breath sounds normal. No respiratory distress. She has no wheezes. She exhibits no tenderness.  Abdominal: Soft. Bowel sounds are normal. She exhibits no distension, no abdominal bruit and no mass. There is no tenderness.  Genitourinary: No breast swelling, tenderness, discharge or bleeding.  L mastectomy site is clear  R breast : Breast exam: No mass, nodules, thickening, tenderness, bulging, retraction, inflamation, nipple discharge or skin changes noted.  No axillary or clavicular LA.      Musculoskeletal: Normal range of motion. She exhibits no edema or tenderness.  Lymphadenopathy:    She has no cervical adenopathy.  Neurological: She is alert.  She has normal reflexes. No cranial nerve deficit. She exhibits normal muscle tone. Coordination normal.  Skin: Skin is warm and dry. No rash noted. No erythema. No pallor.  Psychiatric: She has a normal mood and affect.          Assessment & Plan:   Problem List Items Addressed This Visit      Musculoskeletal and Integument   Osteopenia    Enc to try other forms of vitamin D - gummies/chewables/drops (will try chewables first with food) Would be open to scheduling a dexa later in the year         Other   Encounter for Medicare annual wellness exam - Primary    Reviewed health habits including diet and exercise and skin cancer prevention Reviewed appropriate screening tests for age  Also reviewed health mt list, fam hx and immunization status , as well as social and family history   See HPI Labs reviewed  Get your mammogram in May prevnar vaccine today  If you are interested in a shingles/zoster vaccine - call your insurance to check on coverage,( you should not get it within 1 month of other vaccines) , then call us for a prescription  for it to take to a pharmacy that gives the shot , or make a nurse visit to get it here depending on your coverage For vitamin D -  I sent a px for gummies in - or you can get them over the counter  Take 4000 iu vitamin D daily  Avoid red meat/ fried foods/ egg yolks/ fatty breakfast meats/ butter, cheese and high fat dairy/ and shellfish    Take care of yourself  Keep exercising       hx:  breast cancer, left IDC, 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 +    Doing well in f/u after L mastectomy Mammogram due in may       PURE HYPERCHOLESTEROLEMIA    Lipids are up- LDL in the 150s Disc goals for lipids and reasons to control them Rev labs with pt Rev low sat fat diet in detail Given literature on low fat diet- pt thinks she can control this on her own       Vitamin D deficiency    Intol of otc ca and D she is taking  Will try vit D gummies Disc imp to  bone and overall health        Other Visit Diagnoses    Need for vaccination with 13-polyvalent pneumococcal conjugate vaccine        Relevant Orders    Pneumococcal conjugate vaccine 13-valent (Completed)

## 2015-06-03 NOTE — Assessment & Plan Note (Signed)
Intol of otc ca and D she is taking  Will try vit D gummies Disc imp to bone and overall health

## 2015-06-03 NOTE — Assessment & Plan Note (Signed)
Doing well in f/u after L mastectomy Mammogram due in may

## 2015-06-03 NOTE — Assessment & Plan Note (Signed)
Reviewed health habits including diet and exercise and skin cancer prevention Reviewed appropriate screening tests for age  Also reviewed health mt list, fam hx and immunization status , as well as social and family history   See HPI Labs reviewed  Get your mammogram in May prevnar vaccine today  If you are interested in a shingles/zoster vaccine - call your insurance to check on coverage,( you should not get it within 1 month of other vaccines) , then call us for a prescription  for it to take to a pharmacy that gives the shot , or make a nurse visit to get it here depending on your coverage For vitamin D -  I sent a px for gummies in - or you can get them over the counter  Take 4000 iu vitamin D daily  Avoid red meat/ fried foods/ egg yolks/ fatty breakfast meats/ butter, cheese and high fat dairy/ and shellfish    Take care of yourself  Keep exercising

## 2015-06-03 NOTE — Assessment & Plan Note (Signed)
Enc to try other forms of vitamin D - gummies/chewables/drops (will try chewables first with food) Would be open to scheduling a dexa later in the year

## 2015-06-03 NOTE — Assessment & Plan Note (Signed)
Lipids are up- LDL in the 150s Disc goals for lipids and reasons to control them Rev labs with pt Rev low sat fat diet in detail Given literature on low fat diet- pt thinks she can control this on her own

## 2015-06-19 DIAGNOSIS — L089 Local infection of the skin and subcutaneous tissue, unspecified: Secondary | ICD-10-CM | POA: Diagnosis not present

## 2015-06-19 DIAGNOSIS — C44212 Basal cell carcinoma of skin of right ear and external auricular canal: Secondary | ICD-10-CM | POA: Diagnosis not present

## 2015-08-09 DIAGNOSIS — Z85828 Personal history of other malignant neoplasm of skin: Secondary | ICD-10-CM | POA: Diagnosis not present

## 2015-11-01 ENCOUNTER — Other Ambulatory Visit: Payer: Self-pay | Admitting: Family Medicine

## 2015-11-01 DIAGNOSIS — Z1231 Encounter for screening mammogram for malignant neoplasm of breast: Secondary | ICD-10-CM

## 2015-11-27 ENCOUNTER — Ambulatory Visit
Admission: RE | Admit: 2015-11-27 | Discharge: 2015-11-27 | Disposition: A | Discharge status: Home / Self Care | Payer: Medicare Other | Source: Ambulatory Visit | Attending: Family Medicine | Admitting: Family Medicine

## 2015-11-27 DIAGNOSIS — Z1231 Encounter for screening mammogram for malignant neoplasm of breast: Secondary | ICD-10-CM

## 2015-11-28 ENCOUNTER — Other Ambulatory Visit: Payer: Self-pay | Admitting: Family Medicine

## 2015-11-28 DIAGNOSIS — R928 Other abnormal and inconclusive findings on diagnostic imaging of breast: Secondary | ICD-10-CM

## 2015-11-29 ENCOUNTER — Other Ambulatory Visit: Payer: Self-pay | Admitting: Internal Medicine

## 2015-11-29 DIAGNOSIS — R928 Other abnormal and inconclusive findings on diagnostic imaging of breast: Secondary | ICD-10-CM

## 2015-12-04 ENCOUNTER — Ambulatory Visit
Admission: RE | Admit: 2015-12-04 | Discharge: 2015-12-04 | Disposition: A | Discharge status: Home / Self Care | Payer: Medicare Other | Source: Ambulatory Visit | Attending: Family Medicine | Admitting: Family Medicine

## 2015-12-04 DIAGNOSIS — N6489 Other specified disorders of breast: Secondary | ICD-10-CM | POA: Diagnosis not present

## 2015-12-04 DIAGNOSIS — R928 Other abnormal and inconclusive findings on diagnostic imaging of breast: Secondary | ICD-10-CM

## 2015-12-04 LAB — HM MAMMOGRAPHY

## 2015-12-13 DIAGNOSIS — L820 Inflamed seborrheic keratosis: Secondary | ICD-10-CM | POA: Diagnosis not present

## 2015-12-13 DIAGNOSIS — D229 Melanocytic nevi, unspecified: Secondary | ICD-10-CM | POA: Diagnosis not present

## 2015-12-13 DIAGNOSIS — D1800 Hemangioma unspecified site: Secondary | ICD-10-CM | POA: Diagnosis not present

## 2015-12-13 DIAGNOSIS — Z85828 Personal history of other malignant neoplasm of skin: Secondary | ICD-10-CM | POA: Diagnosis not present

## 2015-12-13 DIAGNOSIS — L812 Freckles: Secondary | ICD-10-CM | POA: Diagnosis not present

## 2015-12-13 DIAGNOSIS — I8310 Varicose veins of unspecified lower extremity with inflammation: Secondary | ICD-10-CM | POA: Diagnosis not present

## 2015-12-13 DIAGNOSIS — L821 Other seborrheic keratosis: Secondary | ICD-10-CM | POA: Diagnosis not present

## 2015-12-13 DIAGNOSIS — L578 Other skin changes due to chronic exposure to nonionizing radiation: Secondary | ICD-10-CM | POA: Diagnosis not present

## 2015-12-13 DIAGNOSIS — L570 Actinic keratosis: Secondary | ICD-10-CM | POA: Diagnosis not present

## 2015-12-13 DIAGNOSIS — I781 Nevus, non-neoplastic: Secondary | ICD-10-CM | POA: Diagnosis not present

## 2015-12-13 DIAGNOSIS — Z1283 Encounter for screening for malignant neoplasm of skin: Secondary | ICD-10-CM | POA: Diagnosis not present

## 2015-12-13 DIAGNOSIS — I8393 Asymptomatic varicose veins of bilateral lower extremities: Secondary | ICD-10-CM | POA: Diagnosis not present

## 2016-04-07 HISTORY — PX: OTHER SURGICAL HISTORY: SHX169

## 2016-05-25 ENCOUNTER — Telehealth: Payer: Self-pay | Admitting: Family Medicine

## 2016-05-25 DIAGNOSIS — E7800 Pure hypercholesterolemia, unspecified: Secondary | ICD-10-CM

## 2016-05-25 DIAGNOSIS — E559 Vitamin D deficiency, unspecified: Secondary | ICD-10-CM

## 2016-05-25 NOTE — Telephone Encounter (Signed)
-----   Message from Ellamae Sia sent at 05/21/2016 12:07 PM EST ----- Regarding: Lab orders for Friday, 2.23.18 Patient is scheduled for CPX labs, please order future labs, Thanks , Karna Christmas

## 2016-05-30 ENCOUNTER — Other Ambulatory Visit (INDEPENDENT_AMBULATORY_CARE_PROVIDER_SITE_OTHER): Payer: Medicare Other

## 2016-05-30 ENCOUNTER — Encounter: Payer: Self-pay | Admitting: Family Medicine

## 2016-05-30 DIAGNOSIS — E559 Vitamin D deficiency, unspecified: Secondary | ICD-10-CM

## 2016-05-30 DIAGNOSIS — E7800 Pure hypercholesterolemia, unspecified: Secondary | ICD-10-CM | POA: Diagnosis not present

## 2016-05-30 LAB — LIPID PANEL
Cholesterol: 238 mg/dL — ABNORMAL HIGH (ref 0–200)
HDL: 72.4 mg/dL (ref >39.00)
LDL Cholesterol: 146 mg/dL — ABNORMAL HIGH (ref 0–99)
NonHDL: 165.46
TRIGLYCERIDES: 97 mg/dL (ref 0.0–149.0)
Total CHOL/HDL Ratio: 3
VLDL: 19.4 mg/dL (ref 0.0–40.0)

## 2016-05-30 LAB — CBC WITH DIFFERENTIAL/PLATELET
Basophils Absolute: 0.1 10*3/uL (ref 0.0–0.1)
Basophils Relative: 1.1 % (ref 0.0–3.0)
EOS ABS: 0.2 10*3/uL (ref 0.0–0.7)
EOS PCT: 3.3 % (ref 0.0–5.0)
HEMATOCRIT: 40.9 % (ref 36.0–46.0)
Hemoglobin: 13.9 g/dL (ref 12.0–15.0)
LYMPHS PCT: 34.1 % (ref 12.0–46.0)
Lymphs Abs: 1.7 10*3/uL (ref 0.7–4.0)
MCHC: 34 g/dL (ref 30.0–36.0)
MCV: 93.1 fl (ref 78.0–100.0)
Monocytes Absolute: 0.4 10*3/uL (ref 0.1–1.0)
Monocytes Relative: 7.3 % (ref 3.0–12.0)
NEUTROS ABS: 2.7 10*3/uL (ref 1.4–7.7)
Neutrophils Relative %: 54.2 % (ref 43.0–77.0)
Platelets: 167 10*3/uL (ref 150.0–400.0)
RBC: 4.4 Mil/uL (ref 3.87–5.11)
RDW: 13 % (ref 11.5–15.5)
WBC: 5 10*3/uL (ref 4.0–10.5)

## 2016-05-30 LAB — COMPREHENSIVE METABOLIC PANEL
ALK PHOS: 70 U/L (ref 39–117)
ALT: 18 U/L (ref 0–35)
AST: 19 U/L (ref 0–37)
Albumin: 4.4 g/dL (ref 3.5–5.2)
BUN: 13 mg/dL (ref 6–23)
CALCIUM: 9.6 mg/dL (ref 8.4–10.5)
CO2: 29 mEq/L (ref 19–32)
Chloride: 107 mEq/L (ref 96–112)
Creatinine, Ser: 0.72 mg/dL (ref 0.40–1.20)
GFR: 85.19 mL/min (ref >60.00)
Glucose, Bld: 99 mg/dL (ref 70–99)
Potassium: 4.5 mEq/L (ref 3.5–5.1)
Sodium: 141 mEq/L (ref 135–145)
Total Bilirubin: 0.9 mg/dL (ref 0.2–1.2)
Total Protein: 6.9 g/dL (ref 6.0–8.3)

## 2016-05-30 LAB — TSH: TSH: 2.39 u[IU]/mL (ref 0.35–4.50)

## 2016-05-30 LAB — VITAMIN D 25 HYDROXY (VIT D DEFICIENCY, FRACTURES): VITD: 27.02 ng/mL — ABNORMAL LOW (ref 30.00–100.00)

## 2016-06-06 ENCOUNTER — Encounter: Payer: Self-pay | Admitting: Family Medicine

## 2016-06-06 ENCOUNTER — Ambulatory Visit (INDEPENDENT_AMBULATORY_CARE_PROVIDER_SITE_OTHER): Payer: Medicare Other | Admitting: Family Medicine

## 2016-06-06 ENCOUNTER — Ambulatory Visit: Payer: Medicare Other

## 2016-06-06 VITALS — BP 110/70 | HR 82 | Temp 98.0°F | Ht 62.0 in | Wt 148.8 lb

## 2016-06-06 DIAGNOSIS — Z853 Personal history of malignant neoplasm of breast: Secondary | ICD-10-CM

## 2016-06-06 DIAGNOSIS — E7800 Pure hypercholesterolemia, unspecified: Secondary | ICD-10-CM

## 2016-06-06 DIAGNOSIS — E2839 Other primary ovarian failure: Secondary | ICD-10-CM

## 2016-06-06 DIAGNOSIS — M8589 Other specified disorders of bone density and structure, multiple sites: Secondary | ICD-10-CM | POA: Diagnosis not present

## 2016-06-06 DIAGNOSIS — Z1159 Encounter for screening for other viral diseases: Secondary | ICD-10-CM

## 2016-06-06 DIAGNOSIS — Z0000 Encounter for general adult medical examination without abnormal findings: Secondary | ICD-10-CM

## 2016-06-06 DIAGNOSIS — E559 Vitamin D deficiency, unspecified: Secondary | ICD-10-CM

## 2016-06-06 NOTE — Progress Notes (Signed)
Pre visit review using our clinic review tool, if applicable. No additional management support is needed unless otherwise documented below in the visit note. 

## 2016-06-06 NOTE — Patient Instructions (Addendum)
If you are interested in a shingles/zoster vaccine - call your insurance to check on coverage,( you should not get it within 1 month of other vaccines) , then call us for a prescription  for it to take to a pharmacy that gives the shot , or make a nurse visit to get it here depending on your coverage   Aim for at least 2000 iu of vitamin D daily  Also get outdoors-that helps too  Stop at check out for dexa referral   For cholesterol   Avoid red meat/ fried foods/ egg yolks/ fatty breakfast meats/ butter, cheese and high fat dairy/ and shellfish

## 2016-06-06 NOTE — Patient Instructions (Signed)
Tina Mendez , Thank you for taking time to come for your Medicare Wellness Visit. I appreciate your ongoing commitment to your health goals. Please review the following plan we discussed and let me know if I can assist you in the future.   These are the goals we discussed: Goals    . Increase physical activity          Starting 06/06/16, I will continue to walk on treadmill 30 min 4-5 days per week.        This is a list of the screening recommended for you and due dates:  Health Maintenance  Topic Date Due  . Flu Shot  07/05/2016*  . Pap Smear  11/16/2016*  . Mammogram  12/03/2016  . Colon Cancer Screening  09/10/2017  . Tetanus Vaccine  11/18/2022  . DEXA scan (bone density measurement)  Completed  .  Hepatitis C: One time screening is recommended by Center for Disease Control  (CDC) for  adults born from [birth year] through 1964.   Completed  . Pneumonia vaccines  Completed  *Topic was postponed. The date shown is not the original due date.   Preventive Care for Adults  A healthy lifestyle and preventive care can promote health and wellness. Preventive health guidelines for adults include the following key practices.  . A routine yearly physical is a good way to check with your health care provider about your health and preventive screening. It is a chance to share any concerns and updates on your health and to receive a thorough exam.  . Visit your dentist for a routine exam and preventive care every 6 months. Brush your teeth twice a day and floss once a day. Good oral hygiene prevents tooth decay and gum disease.  . The frequency of eye exams is based on your age, health, family medical history, use  of contact lenses, and other factors. Follow your health care provider's ecommendations for frequency of eye exams.  . Eat a healthy diet. Foods like vegetables, fruits, whole grains, low-fat dairy products, and lean protein foods contain the nutrients you need without too many calories.  Decrease your intake of foods high in solid fats, added sugars, and salt. Eat the right amount of calories for you. Get information about a proper diet from your health care provider, if necessary.  . Regular physical exercise is one of the most important things you can do for your health. Most adults should get at least 150 minutes of moderate-intensity exercise (any activity that increases your heart rate and causes you to sweat) each week. In addition, most adults need muscle-strengthening exercises on 2 or more days a week.  Silver Sneakers may be a benefit available to you. To determine eligibility, you may visit the website: www.silversneakers.com or contact program at 2251469745 Mon-Fri between 8AM-8PM.   . Maintain a healthy weight. The body mass index (BMI) is a screening tool to identify possible weight problems. It provides an estimate of body fat based on height and weight. Your health care provider can find your BMI and can help you achieve or maintain a healthy weight.   For adults 20 years and older: ? A BMI below 18.5 is considered underweight. ? A BMI of 18.5 to 24.9 is normal. ? A BMI of 25 to 29.9 is considered overweight. ? A BMI of 30 and above is considered obese.   . Maintain normal blood lipids and cholesterol levels by exercising and minimizing your intake of saturated fat. Eat a  balanced diet with plenty of fruit and vegetables. Blood tests for lipids and cholesterol should begin at age 27 and be repeated every 5 years. If your lipid or cholesterol levels are high, you are over 50, or you are at high risk for heart disease, you may need your cholesterol levels checked more frequently. Ongoing high lipid and cholesterol levels should be treated with medicines if diet and exercise are not working.  . If you smoke, find out from your health care provider how to quit. If you do not use tobacco, please do not start.  . If you choose to drink alcohol, please do not consume  more than 2 drinks per day. One drink is considered to be 12 ounces (355 mL) of beer, 5 ounces (148 mL) of wine, or 1.5 ounces (44 mL) of liquor.  . If you are 52-59 years old, ask your health care provider if you should take aspirin to prevent strokes.  . Use sunscreen. Apply sunscreen liberally and repeatedly throughout the day. You should seek shade when your shadow is shorter than you. Protect yourself by wearing long sleeves, pants, a wide-brimmed hat, and sunglasses year round, whenever you are outdoors.  . Once a month, do a whole body skin exam, using a mirror to look at the skin on your back. Tell your health care provider of new moles, moles that have irregular borders, moles that are larger than a pencil eraser, or moles that have changed in shape or color.

## 2016-06-06 NOTE — Progress Notes (Signed)
Subjective:   Tina Mendez is a 71 y.o. female who presents for Medicare Annual (Subsequent) preventive examination.  Review of Systems:  N/A Cardiac Risk Factors include: advanced age (>67men, >57 women);dyslipidemia     Objective:     Vitals: BP 110/70 (BP Location: Left Arm, Patient Position: Sitting, Cuff Size: Normal)   Pulse 82   Temp 98 F (36.7 C) (Oral)   Ht 5\' 2"  (1.575 m) Comment: no shoes  Wt 148 lb 12 oz (67.5 kg)   SpO2 98%   BMI 27.21 kg/m   Body mass index is 27.21 kg/m.   Tobacco History  Smoking Status  . Never Smoker  Smokeless Tobacco  . Never Used     Counseling given: No   Past Medical History:  Diagnosis Date  . Basal cell carcinoma of skin   . Cancer ([place]) 2002   L BREAST  . Colon polyp   . History of anal fissures   . Plantar fasciitis    Past Surgical History:  Procedure Laterality Date  . Basal Cell Lesion    . Breast CA signs - Chemo  06/2000  . BREAST CYST EXCISION  1970   rt  . CESAREAN SECTION  1980  . MASTECTOMY  2001   LEFT   Family History  Problem Relation Age of Onset  . Diabetes Mother   . Heart disease Mother   . Colon polyps Father   . Cancer Paternal Aunt     breast  . Cancer Paternal Uncle     Lung CA, smoker  . Colon cancer Neg Hx    History  Sexual Activity  . Sexual activity: No    Outpatient Encounter Prescriptions as of 06/06/2016  Medication Sig  . Cholecalciferol 1000 units CHEW Chew 4 tablets (4,000 Units total) by mouth daily.   No facility-administered encounter medications on file as of 06/06/2016.     Activities of Daily Living In your present state of health, do you have any difficulty performing the following activities: 06/06/2016  Hearing? N  Vision? N  Difficulty concentrating or making decisions? N  Walking or climbing stairs? N  Dressing or bathing? N  Doing errands, shopping? N  Preparing Food and eating ? N  Using the Toilet? N  In the past six months, have you accidently  leaked urine? N  Do you have problems with loss of bowel control? N  Managing your Medications? N  Managing your Finances? N  Housekeeping or managing your Housekeeping? N  Some recent data might be hidden    Patient Care Team: Abner Greenspan, MD as PCP - General Neldon Mc, MD (General Surgery) Chauncey Cruel, MD (Hematology and Oncology) Eula Flax, DO as Consulting Physician (Optometry)    Assessment:     Hearing Screening   125Hz  250Hz  500Hz  1000Hz  2000Hz  3000Hz  4000Hz  6000Hz  8000Hz   Right ear:   40 40 40  40    Left ear:   40 40 40  40    Vision Screening Comments: Last vision exam in April 2017 with Dr. Eula Flax   Exercise Activities and Dietary recommendations Current Exercise Habits: Home exercise routine, Type of exercise: treadmill, Time (Minutes): 30, Frequency (Times/Week): 5, Weekly Exercise (Minutes/Week): 150, Intensity: Moderate, Exercise limited by: None identified  Goals    . Increase physical activity          Starting 06/06/16, I will continue to walk on treadmill 30 min 4-5 days per week.  Fall Risk Fall Risk  06/06/2016 06/01/2015 05/29/2014 11/18/2012  Falls in the past year? No No No No   Depression Screen PHQ 2/9 Scores 06/06/2016 06/01/2015 05/29/2014 11/18/2012  PHQ - 2 Score 0 0 0 0     Cognitive Function MMSE - Mini Mental State Exam 06/06/2016  Orientation to time 5  Orientation to Place 5  Registration 3  Attention/ Calculation 0  Recall 3  Language- name 2 objects 0  Language- repeat 1  Language- follow 3 step command 3  Language- read & follow direction 0  Write a sentence 0  Copy design 0  Total score 20     PLEASE NOTE: A Mini-Cog screen was completed. Maximum score is 20. A value of 0 denotes this part of Folstein MMSE was not completed or the patient failed this part of the Mini-Cog screening.   Mini-Cog Screening Orientation to Time - Max 5 pts Orientation to Place - Max 5 pts Registration - Max 3 pts Recall -  Max 3 pts Language Repeat - Max 1 pts Language Follow 3 Step Command - Max 3 pts     Immunization History  Administered Date(s) Administered  . Pneumococcal Conjugate-13 06/01/2015  . Pneumococcal Polysaccharide-23 05/29/2014  . Td 01/27/2003, 11/17/2012   Screening Tests Health Maintenance  Topic Date Due  . INFLUENZA VACCINE  07/05/2016 (Originally 11/06/2015)  . PAP SMEAR  11/16/2016 (Originally 11/18/2014)  . MAMMOGRAM  12/03/2016  . COLONOSCOPY  09/10/2017  . TETANUS/TDAP  11/18/2022  . DEXA SCAN  Completed  . Hepatitis C Screening  Completed  . PNA vac Low Risk Adult  Completed      Plan:     I have personally reviewed and addressed the Medicare Annual Wellness questionnaire and have noted the following in the patient's chart:  A. Medical and social history B. Use of alcohol, tobacco or illicit drugs  C. Current medications and supplements D. Functional ability and status E.  Nutritional status F.  Physical activity G. Advance directives H. List of other physicians I.  Hospitalizations, surgeries, and ER visits in previous 12 months J.  [place] to include hearing, vision, cognitive, depression L. Referrals and appointments - none  In addition, I have reviewed and discussed with patient certain preventive protocols, quality metrics, and best practice recommendations. A written personalized care plan for preventive services as well as general preventive health recommendations were provided to patient.  See attached scanned questionnaire for additional information.   Signed,   Lindell Noe, MHA, BS, LPN Health Coach

## 2016-06-06 NOTE — Progress Notes (Signed)
PCP notes:   Health maintenance:  Hep C screening - completed Flu vaccine - pt declined   Abnormal screenings:   None  Patient concerns:   None  Nurse concerns:  None  Next PCP appt:   06/06/16 @ 0930  I reviewed health advisor's note, was available for consultation, and agree with documentation and plan. Loura Pardon MD

## 2016-06-06 NOTE — Progress Notes (Signed)
Sbjective:    Patient ID: Tina Mendez, female    DOB: 07-21-1945, 71 y.o.   MRN: [id number]  HPI Here for annual follow up of chronic medical problems   Had AMW today  No concerns   Feeling fair overall  Has had uri 2 times this season - better now    Wt Readings from Last 3 Encounters:  06/06/16 148 lb 12 oz (67.5 kg)  06/06/16 148 lb 12 oz (67.5 kg)  06/01/15 145 lb 8 oz (66 kg)  walks on treadmill 20 min 4 days per week  Eating a healthy diet - too much fruit for a while  bmi 27.1  Hep C screen done today and pending   Mammogram 8/17 neg (with cysts on [place]) Hx of breast cancer L in 2002 with chemo with partial mastectomy  Self breast exam - nl   Pap/gyn care : pap 8/14-normal , no gyn symptoms at all  Was never on tamoxifen or other anti hormone agent    Zoster vaccine -has not had one , waiting for the newer vaccine  Flu vaccine -declines Tetanus vaccine 8/14   Colonoscopy/ screening : 6/14 tubular adenoma   dexa 8/14 osteopenia  no falls or fractures  Takes D  She tries to get calcium from food  D is low 27- was not taking enough - getting 1000 iu  Does exercise   Hx of hyperlipidemia  Lab Results  Component Value Date   CHOL 238 (H) 05/30/2016   CHOL 243 (H) 05/25/2015   CHOL 212 (H) 05/19/2014   Lab Results  Component Value Date   HDL 72.40 05/30/2016   HDL 70.80 05/25/2015   HDL 69.90 05/19/2014   Lab Results  Component Value Date   LDLCALC 146 (H) 05/30/2016   LDLCALC 157 (H) 05/25/2015   LDLCALC 123 (H) 05/19/2014   Lab Results  Component Value Date   TRIG 97.0 05/30/2016   TRIG 75.0 05/25/2015   TRIG 97.0 05/19/2014   Lab Results  Component Value Date   CHOLHDL 3 05/30/2016   CHOLHDL 3 05/25/2015   CHOLHDL 3 05/19/2014   Lab Results  Component Value Date   LDLDIRECT 145.7 05/20/2013   LDLDIRECT 153.9 11/11/2012   LDLDIRECT 152.9 09/29/2008   A little improvement from last year  Diet - still eats eggs / occ bacon or  sausage  Some red meat  Declines statin    Results for orders placed or performed in visit on 05/30/16  Comprehensive metabolic panel  Result Value Ref Range   Sodium 141 135 - 145 mEq/L   Potassium 4.5 3.5 - 5.1 mEq/L   Chloride 107 96 - 112 mEq/L   CO2 29 19 - 32 mEq/L   Glucose, Bld 99 70 - 99 mg/dL   BUN 13 6 - 23 mg/dL   Creatinine, Ser 0.72 0.40 - 1.20 mg/dL   Total Bilirubin 0.9 0.2 - 1.2 mg/dL   Alkaline Phosphatase 70 39 - 117 U/L   AST 19 0 - 37 U/L   ALT 18 0 - 35 U/L   Total Protein 6.9 6.0 - 8.3 g/dL   Albumin 4.4 3.5 - 5.2 g/dL   Calcium 9.6 8.4 - 10.5 mg/dL   GFR 85.19 >60.00 mL/min  Lipid panel  Result Value Ref Range   Cholesterol 238 (H) 0 - 200 mg/dL   Triglycerides 97.0 0.0 - 149.0 mg/dL   HDL 72.40 >39.00 mg/dL   VLDL 19.4 0.0 - 40.0 mg/dL  LDL Cholesterol 146 (H) 0 - 99 mg/dL   Total CHOL/HDL Ratio 3    NonHDL 165.46   TSH  Result Value Ref Range   TSH 2.39 0.35 - 4.50 uIU/mL  CBC with Differential/Platelet  Result Value Ref Range   WBC 5.0 4.0 - 10.5 K/uL   RBC 4.40 3.87 - 5.11 Mil/uL   Hemoglobin 13.9 12.0 - 15.0 g/dL   HCT 40.9 36.0 - 46.0 %   MCV 93.1 78.0 - 100.0 fl   MCHC 34.0 30.0 - 36.0 g/dL   RDW 13.0 11.5 - 15.5 %   Platelets 167.0 150.0 - 400.0 K/uL   Neutrophils Relative % 54.2 43.0 - 77.0 %   Lymphocytes Relative 34.1 12.0 - 46.0 %   Monocytes Relative 7.3 3.0 - 12.0 %   Eosinophils Relative 3.3 0.0 - 5.0 %   Basophils Relative 1.1 0.0 - 3.0 %   Neutro Abs 2.7 1.4 - 7.7 K/uL   Lymphs Abs 1.7 0.7 - 4.0 K/uL   Monocytes Absolute 0.4 0.1 - 1.0 K/uL   Eosinophils Absolute 0.2 0.0 - 0.7 K/uL   Basophils Absolute 0.1 0.0 - 0.1 K/uL  VITAMIN D 25 Hydroxy (Vit-D Deficiency, Fractures)  Result Value Ref Range   VITD 27.02 (L) 30.00 - 100.00 ng/mL     Patient Active Problem List   Diagnosis Date Noted  . Estrogen deficiency 06/06/2016  . Vitamin D deficiency 05/29/2014  . Encounter for routine gynecological examination  11/17/2012  . Encounter for Medicare annual wellness exam 11/10/2012  . PURE HYPERCHOLESTEROLEMIA 06/23/2008  . Osteopenia 06/16/2008  . 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 Basal cell carcinoma of skin   . Cancer ([place]) 2002   L BREAST  . Colon polyp   . History of anal fissures   . Plantar fasciitis    Past Surgical History:  Procedure Laterality Date  . Basal Cell Lesion    . Breast CA signs - Chemo  06/2000  . BREAST CYST EXCISION  1970   rt  . CESAREAN SECTION  1980  . MASTECTOMY  2001   LEFT   Social History  Substance Use Topics  . Smoking status: Never Smoker  . Smokeless tobacco: Never Used  . Alcohol use No   Family History  Problem Relation Age of Onset  . Diabetes Mother   . Heart disease Mother   . Colon polyps Father   . Cancer Paternal Aunt     breast  . Cancer Paternal Uncle     Lung CA, smoker  . Colon cancer Neg Hx    Allergies  Allergen Reactions  . Sulfonamide Derivatives Nausea Only   Current Outpatient Prescriptions on File Prior to Visit  Medication Sig Dispense Refill  . Cholecalciferol 1000 units CHEW Chew 4 tablets (4,000 Units total) by mouth daily. 120 tablet 11   No current facility-administered medications on file prior to visit.     Review of Systems Review of Systems  Constitutional: Negative for fever, appetite change, fatigue and unexpected weight change.  Eyes: Negative for pain and visual disturbance.  Respiratory: Negative for cough and shortness of breath.   Cardiovascular: Negative for cp or palpitations    Gastrointestinal: Negative for nausea, diarrhea and constipation.  Genitourinary: Negative for urgency and frequency.  Skin: Negative for pallor or rash   Neurological: Negative for weakness, light-headedness, numbness and headaches.  Hematological: Negative for adenopathy. Does  not bruise/bleed easily.  Psychiatric/Behavioral: Negative for dysphoric  mood. The patient is not nervous/anxious.         Objective:   Physical Exam  Constitutional: She appears well-developed and well-nourished. No distress.  Well appearing   HENT:  Head: Normocephalic and atraumatic.  Right Ear: External ear normal.  Left Ear: External ear normal.  Mouth/Throat: Oropharynx is clear and moist.  Eyes: Conjunctivae and EOM are normal. Pupils are equal, round, and reactive to light. No scleral icterus.  Neck: Normal range of motion. Neck supple. No JVD present. Carotid bruit is not present. No thyromegaly present.  Cardiovascular: Normal rate, regular rhythm, normal heart sounds and intact distal pulses.  Exam reveals no gallop.   Pulmonary/Chest: Effort normal and breath sounds normal. No respiratory distress. She has no wheezes. She exhibits no tenderness.  Abdominal: Soft. Bowel sounds are normal. She exhibits no distension, no abdominal bruit and no mass. There is no tenderness.  Genitourinary: No breast swelling, tenderness, discharge or bleeding.  Genitourinary Comments: R breast: Breast exam: No mass, nodules, thickening, tenderness, bulging, retraction, inflamation, nipple discharge or skin changes noted.  No axillary or clavicular LA.      R mastectomy site is clear of masses or tenderness  Musculoskeletal: Normal range of motion. She exhibits no edema or tenderness.  No kyphosis   Lymphadenopathy:    She has no cervical adenopathy.  Neurological: She is alert. She has normal reflexes. No cranial nerve deficit. She exhibits normal muscle tone. Coordination normal.  Skin: Skin is warm and dry. No rash noted. No erythema. No pallor.  Lentigines diffusely    Psychiatric: She has a normal mood and affect.          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

## 2016-06-07 LAB — HEPATITIS C ANTIBODY: HCV Ab: NEGATIVE

## 2016-06-07 NOTE — Assessment & Plan Note (Addendum)
Disc goals for lipids and reasons to control them Rev labs with pt Rev low sat fat diet in detail  Very good HDL  LDL is high at 146- but improved  Disc option of a statin-she declines it at this time

## 2016-06-07 NOTE — Assessment & Plan Note (Signed)
Doing well  No recurrence  Has had cysts on [place] -watching  Due for imaging in august

## 2016-06-07 NOTE — Assessment & Plan Note (Signed)
Ref for dexa 

## 2016-06-07 NOTE — Assessment & Plan Note (Signed)
Due for dexa -ordered  On D - will inc to 2000 iu daily  Exercise recommended No falls or fractures

## 2016-06-07 NOTE — Assessment & Plan Note (Signed)
Level os 27 Inc D to 2000 or more iu daily  Disc imp to bone and overall health

## 2016-07-15 ENCOUNTER — Ambulatory Visit (INDEPENDENT_AMBULATORY_CARE_PROVIDER_SITE_OTHER)
Admission: RE | Admit: 2016-07-15 | Discharge: 2016-07-15 | Disposition: A | Discharge status: Home / Self Care | Payer: Medicare Other | Source: Ambulatory Visit | Attending: Family Medicine | Admitting: Family Medicine

## 2016-07-15 DIAGNOSIS — E2839 Other primary ovarian failure: Secondary | ICD-10-CM

## 2016-07-17 ENCOUNTER — Encounter: Payer: Self-pay | Admitting: Family Medicine

## 2016-07-31 DIAGNOSIS — D485 Neoplasm of uncertain behavior of skin: Secondary | ICD-10-CM | POA: Diagnosis not present

## 2016-07-31 DIAGNOSIS — L570 Actinic keratosis: Secondary | ICD-10-CM | POA: Diagnosis not present

## 2016-07-31 DIAGNOSIS — L578 Other skin changes due to chronic exposure to nonionizing radiation: Secondary | ICD-10-CM | POA: Diagnosis not present

## 2016-07-31 DIAGNOSIS — C44319 Basal cell carcinoma of skin of other parts of face: Secondary | ICD-10-CM | POA: Diagnosis not present

## 2016-07-31 DIAGNOSIS — L820 Inflamed seborrheic keratosis: Secondary | ICD-10-CM | POA: Diagnosis not present

## 2016-07-31 DIAGNOSIS — C44311 Basal cell carcinoma of skin of nose: Secondary | ICD-10-CM | POA: Diagnosis not present

## 2016-07-31 DIAGNOSIS — Z85828 Personal history of other malignant neoplasm of skin: Secondary | ICD-10-CM | POA: Diagnosis not present

## 2016-08-29 ENCOUNTER — Ambulatory Visit (INDEPENDENT_AMBULATORY_CARE_PROVIDER_SITE_OTHER): Payer: Medicare Other | Admitting: Internal Medicine

## 2016-08-29 ENCOUNTER — Encounter: Payer: Self-pay | Admitting: Internal Medicine

## 2016-08-29 VITALS — BP 132/86 | HR 80 | Temp 98.2°F | Resp 16 | Wt 143.0 lb

## 2016-08-29 DIAGNOSIS — J069 Acute upper respiratory infection, unspecified: Secondary | ICD-10-CM | POA: Insufficient documentation

## 2016-08-29 MED ORDER — BENZONATATE 200 MG PO CAPS: 200.0000 mg | ORAL_CAPSULE | Freq: Three times a day (TID) | ORAL | 0 refills | Status: DC | PRN

## 2016-08-29 NOTE — Assessment & Plan Note (Signed)
Viral  No antibiotic needed Symptomatic treatment Tessalon perles prn   Call if no improvement

## 2016-08-29 NOTE — Progress Notes (Signed)
Subjective:    Patient ID: Tina Mendez, female    DOB: April 06, 1946, 71 y.o.   MRN: [id number]  HPI She is here for an acute visit for cold symptoms.   Her symptoms started as allergies 4 days ago.  She is experiencing cough, nasal congestion and has seen blood in her nasal mucus, low grade fever, mild ear pain yesterday.  She was achy last night.  She has had some mild wheezing on occasional.   She has tried taking allegra D, advil with some improvement in symptoms.   Medications and allergies reviewed with patient and updated if appropriate.  Patient Active Problem List   Diagnosis Date Noted  . Estrogen deficiency 06/06/2016  . Vitamin D deficiency 05/29/2014  . Encounter for routine gynecological examination 11/17/2012  . Encounter for Medicare annual wellness exam 11/10/2012  . PURE HYPERCHOLESTEROLEMIA 06/23/2008  . Osteoporosis 06/16/2008  . 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 Cholecalciferol 1000 units CHEW Chew 4 tablets (4,000 Units total) by mouth daily. 120 tablet 11   No current facility-administered medications on file prior to visit.     Past Medical History:  Diagnosis Date  . Basal cell carcinoma of skin   . Cancer ([place]) 2002   L BREAST  . Colon polyp   . History of anal fissures   . Plantar fasciitis     Past Surgical History:  Procedure Laterality Date  . Basal Cell Lesion    . Breast CA signs - Chemo  06/2000  . BREAST CYST EXCISION  1970   rt  . CESAREAN SECTION  1980  . MASTECTOMY  2001   LEFT    Social History   Social History  . Marital status: Married    Spouse name: N/A  . Number of children: N/A  . Years of education: N/A   Occupational History  . [employer]   Social History Main Topics  . Smoking status: Never Smoker  . Smokeless tobacco: Never Used  . Alcohol use No    . Drug use: No  . Sexual activity: No   Other Topics Concern  . Not on file   Social History Narrative   Regular exercise:  No    Family History  Problem Relation Age of Onset  . Diabetes Mother   . Heart disease Mother   . Colon polyps Father   . Cancer Paternal Aunt        breast  . Cancer Paternal Uncle        Lung CA, smoker  . Colon cancer Neg Hx     Review of Systems  Constitutional: Positive for appetite change (decreased) and fever (low grade, 100).  HENT: Positive for congestion (yellow, some blood) and ear pain (right ear pain yesterday). Negative for postnasal drip, rhinorrhea, sinus pain, sinus pressure, sneezing and sore throat.   Respiratory: Positive for cough (dry, rare sputum - no color) and wheezing (occ, mild). Negative for shortness of breath.   Gastrointestinal: Negative for nausea.  Musculoskeletal: Positive for myalgias (mild).  Neurological: Negative for dizziness, light-headedness and headaches.       Objective:   Vitals:   08/29/16 1432  BP: 132/86  Pulse: 80  Resp: 16  Temp: 98.2 F (36.8 C)   Filed Weights   08/29/16 1432  Weight: 143 lb (64.9 kg)  Body mass index is 26.16 kg/m.  Wt Readings from Last 3 Encounters:  08/29/16 143 lb (64.9 kg)  06/06/16 148 lb 12 oz (67.5 kg)  06/06/16 148 lb 12 oz (67.5 kg)     Physical Exam GENERAL APPEARANCE: Appears stated age, well appearing, NAD EYES: conjunctiva clear, no icterus HEENT: bilateral tympanic membranes and ear canals normal, oropharynx with no erythema, no thyromegaly, trachea midline, no cervical or supraclavicular lymphadenopathy LUNGS: Clear to auscultation without wheeze or crackles, unlabored breathing, good air entry bilaterally HEART: Normal S1,S2 without murmurs EXTREMITIES: Without clubbing, cyanosis, or edema        Assessment & Plan:   See Problem List for Assessment and Plan of chronic medical problems.

## 2016-08-29 NOTE — Patient Instructions (Signed)
You likely a viral cold.   No antibiotic is needed.   If your symptoms worsen or fail to improve, please contact our office for further instruction, or in case of emergency go directly to the emergency room at the closest medical facility.   General Recommendations:    Please drink plenty of fluids.  Get plenty of rest   Sleep in humidified air  Use saline nasal sprays  Netti pot  OTC Medications:  Decongestants - helps relieve congestion   Flonase (generic fluticasone) or Nasacort (generic triamcinolone) - please make sure to use the "cross-over" technique at a 45 degree angle towards the opposite eye as opposed to straight up the nasal passageway.   Sudafed (generic pseudoephedrine - Note this is the one that is available behind the pharmacy counter); Products with phenylephrine (-PE) may also be used but is often not as effective as pseudoephedrine.   If you have HIGH BLOOD PRESSURE - Coricidin HBP; AVOID any product that is -D as this contains pseudoephedrine which may increase your blood pressure.  Afrin (oxymetazoline) every 6-8 hours for up to 3 days.  Allergies - helps relieve runny nose, itchy eyes and sneezing   Claritin (generic loratidine), Allegra (fexofenidine), or Zyrtec (generic cyrterizine) for runny nose. These medications should not cause drowsiness.  Note - Benadryl (generic diphenhydramine) may be used however may cause drowsiness  Cough -   Delsym or Robitussin (generic dextromethorphan)  Expectorants - helps loosen mucus to ease removal   Mucinex (generic guaifenesin) as directed on the package.  Headaches / General Aches   Tylenol (generic acetaminophen) - DO NOT EXCEED 3 grams (3,000 mg) in a 24 hour time period  Advil/Motrin (generic ibuprofen)  Sore Throat -   Salt water gargle   Chloraseptic (generic benzocaine) spray or lozenges / Sucrets (generic dyclonine)

## 2016-10-17 ENCOUNTER — Encounter: Payer: Self-pay | Admitting: Internal Medicine

## 2016-10-17 ENCOUNTER — Ambulatory Visit (INDEPENDENT_AMBULATORY_CARE_PROVIDER_SITE_OTHER): Payer: Medicare Other | Admitting: Internal Medicine

## 2016-10-17 VITALS — BP 104/64 | HR 72 | Temp 98.0°F | Wt 147.0 lb

## 2016-10-17 DIAGNOSIS — T7840XA Allergy, unspecified, initial encounter: Secondary | ICD-10-CM | POA: Diagnosis not present

## 2016-10-17 NOTE — Assessment & Plan Note (Signed)
Fairly large skin lesion on forearm Not infected (so not MRSA) Likely a bite with local reaction Discussed cool compresses if any symptoms Antibiotic cream only if opens Consider antibiotic next week if worsens

## 2016-10-17 NOTE — Progress Notes (Signed)
   Subjective:    Patient ID: Tina Mendez, female    DOB: 12-Jun-1945, 71 y.o.   MRN: [id number]  HPI Here due to skin lesion that is growing  Started on distal right forearm --- on radial side Was out camping--doesn't remember injury or bite Started as white pimple Got big and red  Not really painful  Tried antibiotic cream  No current outpatient prescriptions on file prior to visit.   No current facility-administered medications on file prior to visit.     Allergies  Allergen Reactions  . Sulfonamide Derivatives Nausea Only    Past Medical History:  Diagnosis Date  . Basal cell carcinoma of skin   . Cancer ([place]) 2002   L BREAST  . Colon polyp   . History of anal fissures   . Plantar fasciitis     Past Surgical History:  Procedure Laterality Date  . Basal Cell Lesion    . Breast CA signs - Chemo  06/2000  . BREAST CYST EXCISION  1970   rt  . CESAREAN SECTION  1980  . MASTECTOMY  2001   LEFT    Family History  Problem Relation Age of Onset  . Diabetes Mother   . Heart disease Mother   . Colon polyps Father   . Cancer Paternal Aunt        breast  . Cancer Paternal Uncle        Lung CA, smoker  . Colon cancer Neg Hx     Social History   Social History  . Marital status: Married    Spouse name: N/A  . Number of children: N/A  . Years of education: N/A   Occupational History  . [employer]   Social History Main Topics  . Smoking status: Never Smoker  . Smokeless tobacco: Never Used  . Alcohol use No  . Drug use: No  . Sexual activity: No   Other Topics Concern  . Not on file   Social History Narrative   Regular exercise:  No   Review of Systems  No prior skin infections No other rash No fever     Objective:   Physical Exam  Constitutional: No distress.  Skin:  51mm raised, faint red lesion on radial distal right forearm Not hot or tender          Assessment & Plan:

## 2016-11-13 DIAGNOSIS — C44311 Basal cell carcinoma of skin of nose: Secondary | ICD-10-CM | POA: Diagnosis not present

## 2016-11-13 DIAGNOSIS — C4491 Basal cell carcinoma of skin, unspecified: Secondary | ICD-10-CM | POA: Insufficient documentation

## 2016-12-17 DIAGNOSIS — C44311 Basal cell carcinoma of skin of nose: Secondary | ICD-10-CM | POA: Diagnosis not present

## 2017-01-13 DIAGNOSIS — C44311 Basal cell carcinoma of skin of nose: Secondary | ICD-10-CM | POA: Diagnosis not present

## 2017-01-15 ENCOUNTER — Other Ambulatory Visit: Payer: Self-pay | Admitting: Family Medicine

## 2017-01-15 DIAGNOSIS — Z1239 Encounter for other screening for malignant neoplasm of breast: Secondary | ICD-10-CM

## 2017-02-03 ENCOUNTER — Ambulatory Visit
Admission: RE | Admit: 2017-02-03 | Discharge: 2017-02-03 | Disposition: A | Discharge status: Home / Self Care | Payer: Medicare Other | Source: Ambulatory Visit | Attending: Family Medicine | Admitting: Family Medicine

## 2017-02-03 DIAGNOSIS — Z1239 Encounter for other screening for malignant neoplasm of breast: Secondary | ICD-10-CM

## 2017-02-03 DIAGNOSIS — Z1231 Encounter for screening mammogram for malignant neoplasm of breast: Secondary | ICD-10-CM | POA: Diagnosis not present

## 2017-05-21 DIAGNOSIS — C44311 Basal cell carcinoma of skin of nose: Secondary | ICD-10-CM | POA: Diagnosis not present

## 2017-06-25 ENCOUNTER — Telehealth: Payer: Self-pay | Admitting: Family Medicine

## 2017-06-25 DIAGNOSIS — M810 Age-related osteoporosis without current pathological fracture: Secondary | ICD-10-CM

## 2017-06-25 DIAGNOSIS — E559 Vitamin D deficiency, unspecified: Secondary | ICD-10-CM

## 2017-06-25 DIAGNOSIS — E7800 Pure hypercholesterolemia, unspecified: Secondary | ICD-10-CM

## 2017-06-25 DIAGNOSIS — Z0000 Encounter for general adult medical examination without abnormal findings: Secondary | ICD-10-CM

## 2017-06-25 NOTE — Telephone Encounter (Signed)
-----   Message from Ellamae Sia sent at 06/17/2017 11:19 AM EDT ----- Regarding: Lab orders for Friday, 3.22.19 Patient is scheduled for CPX labs, please order future labs, Thanks , Karna Christmas

## 2017-06-26 ENCOUNTER — Other Ambulatory Visit (INDEPENDENT_AMBULATORY_CARE_PROVIDER_SITE_OTHER): Payer: Medicare Other

## 2017-06-26 DIAGNOSIS — E559 Vitamin D deficiency, unspecified: Secondary | ICD-10-CM | POA: Diagnosis not present

## 2017-06-26 DIAGNOSIS — Z0000 Encounter for general adult medical examination without abnormal findings: Secondary | ICD-10-CM | POA: Diagnosis not present

## 2017-06-26 DIAGNOSIS — E7800 Pure hypercholesterolemia, unspecified: Secondary | ICD-10-CM

## 2017-06-26 LAB — COMPREHENSIVE METABOLIC PANEL
ALK PHOS: 63 U/L (ref 39–117)
ALT: 15 U/L (ref 0–35)
AST: 22 U/L (ref 0–37)
Albumin: 4.5 g/dL (ref 3.5–5.2)
BUN: 20 mg/dL (ref 6–23)
CHLORIDE: 106 meq/L (ref 96–112)
CO2: 23 mEq/L (ref 19–32)
CREATININE: 0.78 mg/dL (ref 0.40–1.20)
Calcium: 10 mg/dL (ref 8.4–10.5)
GFR: 77.43 mL/min (ref >60.00)
GLUCOSE: 94 mg/dL (ref 70–99)
Potassium: 4.6 mEq/L (ref 3.5–5.1)
SODIUM: 142 meq/L (ref 135–145)
TOTAL PROTEIN: 7.3 g/dL (ref 6.0–8.3)
Total Bilirubin: 1.1 mg/dL (ref 0.2–1.2)

## 2017-06-26 LAB — LIPID PANEL
CHOLESTEROL: 243 mg/dL — AB (ref 0–200)
HDL: 70.9 mg/dL (ref >39.00)
LDL CALC: 157 mg/dL — AB (ref 0–99)
NonHDL: 171.8
Total CHOL/HDL Ratio: 3
Triglycerides: 76 mg/dL (ref 0.0–149.0)
VLDL: 15.2 mg/dL (ref 0.0–40.0)

## 2017-06-26 LAB — VITAMIN D 25 HYDROXY (VIT D DEFICIENCY, FRACTURES): VITD: 40.51 ng/mL (ref 30.00–100.00)

## 2017-06-26 LAB — CBC WITH DIFFERENTIAL/PLATELET
BASOS PCT: 0.9 % (ref 0.0–3.0)
Basophils Absolute: 0 10*3/uL (ref 0.0–0.1)
EOS ABS: 0.1 10*3/uL (ref 0.0–0.7)
Eosinophils Relative: 2.2 % (ref 0.0–5.0)
HCT: 41.1 % (ref 36.0–46.0)
HEMOGLOBIN: 14 g/dL (ref 12.0–15.0)
LYMPHS ABS: 1.4 10*3/uL (ref 0.7–4.0)
Lymphocytes Relative: 31.2 % (ref 12.0–46.0)
MCHC: 34.2 g/dL (ref 30.0–36.0)
MCV: 94.3 fl (ref 78.0–100.0)
MONO ABS: 0.4 10*3/uL (ref 0.1–1.0)
Monocytes Relative: 9.1 % (ref 3.0–12.0)
NEUTROS PCT: 56.6 % (ref 43.0–77.0)
Neutro Abs: 2.5 10*3/uL (ref 1.4–7.7)
Platelets: 167 10*3/uL (ref 150.0–400.0)
RBC: 4.35 Mil/uL (ref 3.87–5.11)
RDW: 12.9 % (ref 11.5–15.5)
WBC: 4.3 10*3/uL (ref 4.0–10.5)

## 2017-06-26 LAB — TSH: TSH: 1.85 u[IU]/mL (ref 0.35–4.50)

## 2017-07-03 ENCOUNTER — Ambulatory Visit (INDEPENDENT_AMBULATORY_CARE_PROVIDER_SITE_OTHER): Payer: Medicare Other | Admitting: Family Medicine

## 2017-07-03 ENCOUNTER — Encounter: Payer: Self-pay | Admitting: Family Medicine

## 2017-07-03 ENCOUNTER — Ambulatory Visit (INDEPENDENT_AMBULATORY_CARE_PROVIDER_SITE_OTHER): Payer: Medicare Other

## 2017-07-03 VITALS — BP 100/70 | HR 78 | Temp 97.8°F | Ht 62.0 in | Wt 141.0 lb

## 2017-07-03 DIAGNOSIS — Z0000 Encounter for general adult medical examination without abnormal findings: Secondary | ICD-10-CM

## 2017-07-03 DIAGNOSIS — E559 Vitamin D deficiency, unspecified: Secondary | ICD-10-CM

## 2017-07-03 DIAGNOSIS — M810 Age-related osteoporosis without current pathological fracture: Secondary | ICD-10-CM | POA: Diagnosis not present

## 2017-07-03 DIAGNOSIS — E7800 Pure hypercholesterolemia, unspecified: Secondary | ICD-10-CM | POA: Diagnosis not present

## 2017-07-03 NOTE — Patient Instructions (Addendum)
If you are interested in the new shingles vaccine (Shingrix) - call your local pharmacy to check on coverage and availability - get on a wait list at your pharmacy   For cholesterol  Avoid red meat/ fried foods/ egg yolks/ fatty breakfast meats/ butter, cheese and high fat dairy/ and shellfish   I think your high cholesterol is hereditary  If no improvement with diet - medication may be necessary   Keep taking good care of yourself

## 2017-07-03 NOTE — Progress Notes (Signed)
Subjective:    Patient ID: Tina Mendez, female    DOB: 1945-04-30, 72 y.o.   MRN: [id number]  HPI Here for annual f/u of chronic medical problems   Feeling pretty good overall   Had amw today -no concerns   Wt Readings from Last 3 Encounters:  07/03/17 141 lb (64 kg)  10/17/16 147 lb (66.7 kg)  08/29/16 143 lb (64.9 kg)  25.79 kg/m   Had a large basal cell surgery on face in aug  Healed up now  Is very careful now    Flu vaccines - plans to get one next year (usually does not get them)  She caught the flu this year   Colonoscopy 6/14 with 5 y recall   Mammogram 10/18 nl  Breast cancer in 2002 with partial mastectomy  Self breast exam - no lumps or changes    dexa 4/18  OP LS  D level is 40.5 -better  No falls / no fractures  Taking her vitamin D regularly  Also calcium with  D (and then extra D)   Zoster status - may consider the shingrix in another year    Hyperlipidemia  Lab Results  Component Value Date   CHOL 243 (H) 06/26/2017   CHOL 238 (H) 05/30/2016   CHOL 243 (H) 05/25/2015   Lab Results  Component Value Date   HDL 70.90 06/26/2017   HDL 72.40 05/30/2016   HDL 70.80 05/25/2015   Lab Results  Component Value Date   LDLCALC 157 (H) 06/26/2017   LDLCALC 146 (H) 05/30/2016   LDLCALC 157 (H) 05/25/2015   Lab Results  Component Value Date   TRIG 76.0 06/26/2017   TRIG 97.0 05/30/2016   TRIG 75.0 05/25/2015   Lab Results  Component Value Date   CHOLHDL 3 06/26/2017   CHOLHDL 3 05/30/2016   CHOLHDL 3 05/25/2015   Lab Results  Component Value Date   LDLDIRECT 145.7 05/20/2013   LDLDIRECT 153.9 11/11/2012   LDLDIRECT 152.9 09/29/2008    LDL is high  Eats healthy for the most part  Some eggs  Was eating a lot of wend[employer] chili (red meat)   Other labs  Results for orders placed or performed in visit on 06/26/17  Lipid panel  Result Value Ref Range   Cholesterol 243 (H) 0 - 200 mg/dL   Triglycerides 76.0 0.0 - 149.0 mg/dL   HDL 70.90 >39.00 mg/dL   VLDL 15.2 0.0 - 40.0 mg/dL   LDL Cholesterol 157 (H) 0 - 99 mg/dL   Total CHOL/HDL Ratio 3    NonHDL 171.80   VITAMIN D 25 Hydroxy (Vit-D Deficiency, Fractures)  Result Value Ref Range   VITD 40.51 30.00 - 100.00 ng/mL  TSH  Result Value Ref Range   TSH 1.85 0.35 - 4.50 uIU/mL  Comprehensive metabolic panel  Result Value Ref Range   Sodium 142 135 - 145 mEq/L   Potassium 4.6 3.5 - 5.1 mEq/L   Chloride 106 96 - 112 mEq/L   CO2 23 19 - 32 mEq/L   Glucose, Bld 94 70 - 99 mg/dL   BUN 20 6 - 23 mg/dL   Creatinine, Ser 0.78 0.40 - 1.20 mg/dL   Total Bilirubin 1.1 0.2 - 1.2 mg/dL   Alkaline Phosphatase 63 39 - 117 U/L   AST 22 0 - 37 U/L   ALT 15 0 - 35 U/L   Total Protein 7.3 6.0 - 8.3 g/dL   Albumin 4.5 3.5 - 5.2 g/dL  Calcium 10.0 8.4 - 10.5 mg/dL   GFR 77.43 >60.00 mL/min  CBC with Differential/Platelet  Result Value Ref Range   WBC 4.3 4.0 - 10.5 K/uL   RBC 4.35 3.87 - 5.11 Mil/uL   Hemoglobin 14.0 12.0 - 15.0 g/dL   HCT 41.1 36.0 - 46.0 %   MCV 94.3 78.0 - 100.0 fl   MCHC 34.2 30.0 - 36.0 g/dL   RDW 12.9 11.5 - 15.5 %   Platelets 167.0 150.0 - 400.0 K/uL   Neutrophils Relative % 56.6 43.0 - 77.0 %   Lymphocytes Relative 31.2 12.0 - 46.0 %   Monocytes Relative 9.1 3.0 - 12.0 %   Eosinophils Relative 2.2 0.0 - 5.0 %   Basophils Relative 0.9 0.0 - 3.0 %   Neutro Abs 2.5 1.4 - 7.7 K/uL   Lymphs Abs 1.4 0.7 - 4.0 K/uL   Monocytes Absolute 0.4 0.1 - 1.0 K/uL   Eosinophils Absolute 0.1 0.0 - 0.7 K/uL   Basophils Absolute 0.0 0.0 - 0.1 K/uL    Patient Active Problem List   Diagnosis Date Noted  . Allergic reaction 10/17/2016  . Estrogen deficiency 06/06/2016  . Routine general medical examination at a health care facility 05/24/2015  . Vitamin D deficiency 05/29/2014  . Encounter for routine gynecological examination 11/17/2012  . Encounter for Medicare annual wellness exam 11/10/2012  . PURE HYPERCHOLESTEROLEMIA 06/23/2008  . Osteoporosis  06/16/2008  . 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 Basal cell carcinoma of skin   . Cancer ([place]) 2002   L BREAST  . Colon polyp   . History of anal fissures   . Plantar fasciitis    Past Surgical History:  Procedure Laterality Date  . Basal Cell Lesion    . Breast CA signs - Chemo  06/2000  . BREAST CYST EXCISION  1970   rt  . CESAREAN SECTION  1980  . MASTECTOMY  2001   LEFT   Social History   Tobacco Use  . Smoking status: Never Smoker  . Smokeless tobacco: Never Used  Substance Use Topics  . Alcohol use: No    Alcohol/week: 0.0 oz  . Drug use: No   Family History  Problem Relation Age of Onset  . Diabetes Mother   . Heart disease Mother   . Colon polyps Father   . Cancer Paternal Aunt        breast  . Cancer Paternal Uncle        Lung CA, smoker  . Colon cancer Neg Hx   . Breast cancer Neg Hx    Allergies  Allergen Reactions  . Sulfonamide Derivatives Nausea Only   Current Outpatient Medications on File Prior to Visit  Medication Sig Dispense Refill  . Calcium Carbonate-Vitamin D3 (CALCIUM 600-D) 600-400 MG-UNIT TABS     . Cholecalciferol (VITAMIN D3) 1000 units CAPS      No current facility-administered medications on file prior to visit.     Review of Systems Review of Systems  Constitutional: Negative for fever, appetite change, fatigue and unexpected weight change.  Eyes: Negative for pain and visual disturbance.  Respiratory: Negative for cough and shortness of breath.   Cardiovascular: Negative for cp or palpitations    Gastrointestinal: Negative for nausea, diarrhea and constipation.  Genitourinary: Negative for urgency and frequency.  Skin: Negative for pallor or rash   Neurological: Negative for weakness, light-headedness, numbness and headaches.  Hematological: Negative for adenopathy. Does not bruise/bleed easily.  Psychiatric/Behavioral: Negative for dysphoric mood. The  patient is not nervous/anxious.         Objective:   Physical Exam  Constitutional: She appears well-developed and well-nourished. No distress.  Well appearing   HENT:  Head: Normocephalic and atraumatic.  Right Ear: External ear normal.  Left Ear: External ear normal.  Mouth/Throat: Oropharynx is clear and moist.  Eyes: Pupils are equal, round, and reactive to light. Conjunctivae and EOM are normal. No scleral icterus.  Neck: Normal range of motion. Neck supple. No JVD present. Carotid bruit is not present. No thyromegaly present.  Cardiovascular: Normal rate, regular rhythm, normal heart sounds and intact distal pulses. Exam reveals no gallop.  Pulmonary/Chest: Effort normal and breath sounds normal. No respiratory distress. She has no wheezes. She exhibits no tenderness. No breast swelling, tenderness, discharge or bleeding.  Abdominal: Soft. Bowel sounds are normal. She exhibits no distension, no abdominal bruit and no mass. There is no tenderness.  Genitourinary: No breast swelling, tenderness, discharge or bleeding.  Genitourinary Comments: Baseline L mastectomy (no masses noted) Breast exam R: No mass, nodules, thickening, tenderness, bulging, retraction, inflamation, nipple discharge or skin changes noted.  No axillary or clavicular LA.      Musculoskeletal: Normal range of motion. She exhibits no edema or tenderness.  No kyphosis   Lymphadenopathy:    She has no cervical adenopathy.  Neurological: She is alert. She has normal reflexes. No cranial nerve deficit. She exhibits normal muscle tone. Coordination normal.  Skin: Skin is warm and dry. No rash noted. No erythema. No pallor.  Well healing scar forehead/nose  Solar lentigines diffusely   Psychiatric: She has a normal mood and affect.          Assessment & Plan:   Problem List Items Addressed This Visit      Musculoskeletal and Integument   Osteoporosis - Primary    On ca and D D level improved No falls or  fractures  Would like to avoid medication  Disc need for calcium/ vitamin D/ wt bearing exercise and bone density test every 2 y to monitor Disc safety/ fracture risk in detail          Other   PURE HYPERCHOLESTEROLEMIA    Disc goals for lipids and reasons to control them Rev labs with pt Rev low sat fat diet in detail Declines medication  LDL is up to 157 Suspect hereditary  She wants to work on diet -handout info given        Vitamin D deficiency    Level is tx in 56s Disc imp to overall and bone health

## 2017-07-03 NOTE — Assessment & Plan Note (Signed)
Disc goals for lipids and reasons to control them Rev labs with pt Rev low sat fat diet in detail Declines medication  LDL is up to 157 Suspect hereditary  She wants to work on diet -handout info given

## 2017-07-03 NOTE — Progress Notes (Signed)
Subjective:   Tina Mendez is a 72 y.o. female who presents for Medicare Annual (Subsequent) preventive examination.  Review of Systems:  N/A Cardiac Risk Factors include: advanced age (>8men, >13 women);dyslipidemia     Objective:     Vitals: BP 100/70 (BP Location: Right Arm, Patient Position: Sitting, Cuff Size: Normal)   Pulse 78   Temp 97.8 F (36.6 C) (Oral)   Ht 5\' 2"  (1.575 m) Comment: no shoes  Wt 141 lb (64 kg)   SpO2 99%   BMI 25.79 kg/m   Body mass index is 25.79 kg/m.  Advanced Directives 07/03/2017 06/06/2016  Does Patient Have a Medical Advance Directive? Yes No  Type of Paramedic of [place];Living will -  Copy of [place] in Chart? No - copy requested -    Tobacco Social History   Tobacco Use  Smoking Status Never Smoker  Smokeless Tobacco Never Used     Counseling given: No   Clinical Intake:  Pre-visit preparation completed: Yes  Pain : No/denies pain Pain Score: 0-No pain     Nutritional Status: BMI 25 -29 Overweight Nutritional Risks: None Diabetes: No  How often do you need to have someone help you when you read instructions, pamphlets, or other written materials from your doctor or pharmacy?: 1 - Never  Interpreter Needed?: No  Comments: pt lives with spouse Information entered by :: LPinson, LPN  Past Medical History:  Diagnosis Date  . Basal cell carcinoma of skin   . Cancer ([place]) 2002   L BREAST  . Colon polyp   . History of anal fissures   . Plantar fasciitis    Past Surgical History:  Procedure Laterality Date  . Basal Cell Lesion    . Breast CA signs - Chemo  06/2000  . BREAST CYST EXCISION  1970   rt  . CESAREAN SECTION  1980  . MASTECTOMY  2001   LEFT   Family History  Problem Relation Age of Onset  . Diabetes Mother   . Heart disease Mother   . Colon polyps Father   . Cancer Paternal Aunt        breast  . Cancer Paternal Uncle        Lung CA, smoker  .  Colon cancer Neg Hx   . Breast cancer Neg Hx    Social History   Socioeconomic History  . Marital status: Married    Spouse name: Not on file  . Number of children: Not on file  . Years of education: Not on file  . Highest education level: Not on file  Occupational History  . Occupation: [employer]    Employer: [employer]  Social Needs  . Financial resource strain: Not on file  . Food insecurity:    Worry: Not on file    Inability: Not on file  . Transportation needs:    Medical: Not on file    Non-medical: Not on file  Tobacco Use  . Smoking status: Never Smoker  . Smokeless tobacco: Never Used  Substance and Sexual Activity  . Alcohol use: No    Alcohol/week: 0.0 oz  . Drug use: No  . Sexual activity: Never  Lifestyle  . Physical activity:    Days per week: Not on file    Minutes per session: Not on file  . Stress: Not on file  Relationships  . Social connections:    Talks on phone: Not on file  Gets together: Not on file    Attends religious service: Not on file    Active member of club or organization: Not on file    Attends meetings of clubs or organizations: Not on file    Relationship status: Not on file  Other Topics Concern  . Not on file  Social History Narrative   Regular exercise:  No    Outpatient Encounter Medications as of 07/03/2017  Medication Sig  . Calcium Carbonate-Vitamin D3 (CALCIUM 600-D) 600-400 MG-UNIT TABS   . Cholecalciferol (VITAMIN D3) 1000 units CAPS   . [DISCONTINUED] Cholecalciferol (VITAMIN D) 2000 units CAPS Take 1 capsule by mouth daily.   No facility-administered encounter medications on file as of 07/03/2017.     Activities of Daily Living In your present state of health, do you have any difficulty performing the following activities: 07/03/2017  Hearing? N  Vision? N  Difficulty concentrating or making decisions? N  Walking or climbing stairs? N  Dressing or bathing? N  Doing errands, shopping?  N  Preparing Food and eating ? N  Using the Toilet? N  In the past six months, have you accidently leaked urine? N  Do you have problems with loss of bowel control? N  Managing your Medications? N  Managing your Finances? N  Housekeeping or managing your Housekeeping? N  Some recent data might be hidden    Patient Care Team: Tower, Wynelle Fanny, MD as PCP - Maxine Glenn, MD (General Surgery) Magrinat, Virgie Dad, MD (Hematology and Oncology) Eula Flax, DO as Consulting Physician (Optometry)    Assessment:   This is a routine wellness examination for Tina Mendez.   Hearing Screening   125Hz  250Hz  500Hz  1000Hz  2000Hz  3000Hz  4000Hz  6000Hz  8000Hz   Right ear:   40 40 40  40    Left ear:   40 40 40  40    Vision Screening Comments: Last vision exam in Oct 2018 with Dr. Glennon Mac  Exercise Activities and Dietary recommendations Current Exercise Habits: Home exercise routine, Type of exercise: treadmill;walking, Time (Minutes): 30, Frequency (Times/Week): 7, Weekly Exercise (Minutes/Week): 210, Intensity: Moderate  Goals    . Increase physical activity     Starting 07/03/2017, I will continue to exercise for 30 minutes 5-7 days per week.        Fall Risk Fall Risk  07/03/2017 06/06/2016 06/01/2015 05/29/2014 11/18/2012  Falls in the past year? No No No No No   Depression Screen PHQ 2/9 Scores 07/03/2017 06/06/2016 06/01/2015 05/29/2014  PHQ - 2 Score 0 0 0 0  PHQ- 9 Score 0 - - -     Cognitive Function MMSE - Mini Mental State Exam 07/03/2017 06/06/2016  Orientation to time 5 5  Orientation to Place 5 5  Registration 3 3  Attention/ Calculation 0 0  Recall 3 3  Language- name 2 objects 0 0  Language- repeat 1 1  Language- follow 3 step command 3 3  Language- read & follow direction 0 0  Write a sentence 0 0  Copy design 0 0  Total score 20 20     PLEASE NOTE: A Mini-Cog screen was completed. Maximum score is 20. A value of 0 denotes this part of [place] MMSE was not  completed or the patient failed this part of the Mini-Cog screening.   Mini-Cog Screening Orientation to Time - Max 5 pts Orientation to Place - Max 5 pts Registration - Max 3 pts Recall - Max 3 pts Language Repeat - Max  1 pts Language Follow 3 Step Command - Max 3 pts     Immunization History  Administered Date(s) Administered  . Pneumococcal Conjugate-13 06/01/2015  . Pneumococcal Polysaccharide-23 05/29/2014  . Td 01/27/2003, 11/17/2012    Screening Tests Health Maintenance  Topic Date Due  . INFLUENZA VACCINE  03/05/2018 (Originally 11/05/2016)  . COLONOSCOPY  09/10/2017  . MAMMOGRAM  02/03/2018  . TETANUS/TDAP  11/18/2022  . DEXA SCAN  Completed  . Hepatitis C Screening  Completed  . PNA vac Low Risk Adult  Completed  . PAP SMEAR  Discontinued       Plan:     I have personally reviewed, addressed, and noted the following in the patient's chart:  A. Medical and social history B. Use of alcohol, tobacco or illicit drugs  C. Current medications and supplements D. Functional ability and status E.  Nutritional status F.  Physical activity G. Advance directives H. List of other physicians I.  Hospitalizations, surgeries, and ER visits in previous 12 months J.  [place] to include hearing, vision, cognitive, depression L. Referrals and appointments - none  In addition, I have reviewed and discussed with patient certain preventive protocols, quality metrics, and best practice recommendations. A written personalized care plan for preventive services as well as general preventive health recommendations were provided to patient.  See attached scanned questionnaire for additional information.   Signed,   Lindell Noe, MHA, BS, LPN Health Coach

## 2017-07-03 NOTE — Assessment & Plan Note (Signed)
On ca and D D level improved No falls or fractures  Would like to avoid medication  Disc need for calcium/ vitamin D/ wt bearing exercise and bone density test every 2 y to monitor Disc safety/ fracture risk in detail

## 2017-07-03 NOTE — Progress Notes (Signed)
PCP notes:   Health maintenance:  Flu vaccine - pt declined  Abnormal screenings:   None  Patient concerns:   None  Nurse concerns:  None  Next PCP appt:   07/03/17 @ 0930  I reviewed health advisor's note, was available for consultation, and agree with documentation and plan. Loura Pardon MD

## 2017-07-03 NOTE — Assessment & Plan Note (Signed)
Level is tx in 40s Disc imp to overall and bone health

## 2017-07-03 NOTE — Patient Instructions (Signed)
Tina Mendez , Thank you for taking time to come for your Medicare Wellness Visit. I appreciate your ongoing commitment to your health goals. Please review the following plan we discussed and let me know if I can assist you in the future.   These are the goals we discussed: Goals    . Increase physical activity     Starting 07/03/2017, I will continue to exercise for 30 minutes 5-7 days per week.        This is a list of the screening recommended for you and due dates:  Health Maintenance  Topic Date Due  . Flu Shot  03/05/2018*  . Colon Cancer Screening  09/10/2017  . Mammogram  02/03/2018  . Tetanus Vaccine  11/18/2022  . DEXA scan (bone density measurement)  Completed  .  Hepatitis C: One time screening is recommended by Center for Disease Control  (CDC) for  adults born from [birth year] through 1964.   Completed  . Pneumonia vaccines  Completed  . Pap Smear  Discontinued  *Topic was postponed. The date shown is not the original due date.   Preventive Care for Adults  A healthy lifestyle and preventive care can promote health and wellness. Preventive health guidelines for adults include the following key practices.  . A routine yearly physical is a good way to check with your health care provider about your health and preventive screening. It is a chance to share any concerns and updates on your health and to receive a thorough exam.  . Visit your dentist for a routine exam and preventive care every 6 months. Brush your teeth twice a day and floss once a day. Good oral hygiene prevents tooth decay and gum disease.  . The frequency of eye exams is based on your age, health, family medical history, use  of contact lenses, and other factors. Follow your health care provider's recommendations for frequency of eye exams.  . Eat a healthy diet. Foods like vegetables, fruits, whole grains, low-fat dairy products, and lean protein foods contain the nutrients you need without too many calories.  Decrease your intake of foods high in solid fats, added sugars, and salt. Eat the right amount of calories for you. Get information about a proper diet from your health care provider, if necessary.  . Regular physical exercise is one of the most important things you can do for your health. Most adults should get at least 150 minutes of moderate-intensity exercise (any activity that increases your heart rate and causes you to sweat) each week. In addition, most adults need muscle-strengthening exercises on 2 or more days a week.  Silver Sneakers may be a benefit available to you. To determine eligibility, you may visit the website: www.silversneakers.com or contact program at 281-028-1243 Mon-Fri between 8AM-8PM.   . Maintain a healthy weight. The body mass index (BMI) is a screening tool to identify possible weight problems. It provides an estimate of body fat based on height and weight. Your health care provider can find your BMI and can help you achieve or maintain a healthy weight.   For adults 20 years and older: ? A BMI below 18.5 is considered underweight. ? A BMI of 18.5 to 24.9 is normal. ? A BMI of 25 to 29.9 is considered overweight. ? A BMI of 30 and above is considered obese.   . Maintain normal blood lipids and cholesterol levels by exercising and minimizing your intake of saturated fat. Eat a balanced diet with plenty of fruit  and vegetables. Blood tests for lipids and cholesterol should begin at age 43 and be repeated every 5 years. If your lipid or cholesterol levels are high, you are over 50, or you are at high risk for heart disease, you may need your cholesterol levels checked more frequently. Ongoing high lipid and cholesterol levels should be treated with medicines if diet and exercise are not working.  . If you smoke, find out from your health care provider how to quit. If you do not use tobacco, please do not start.  . If you choose to drink alcohol, please do not consume  more than 2 drinks per day. One drink is considered to be 12 ounces (355 mL) of beer, 5 ounces (148 mL) of wine, or 1.5 ounces (44 mL) of liquor.  . If you are 62-73 years old, ask your health care provider if you should take aspirin to prevent strokes.  . Use sunscreen. Apply sunscreen liberally and repeatedly throughout the day. You should seek shade when your shadow is shorter than you. Protect yourself by wearing long sleeves, pants, a wide-brimmed hat, and sunglasses year round, whenever you are outdoors.  . Once a month, do a whole body skin exam, using a mirror to look at the skin on your back. Tell your health care provider of new moles, moles that have irregular borders, moles that are larger than a pencil eraser, or moles that have changed in shape or color.

## 2017-07-13 DIAGNOSIS — L570 Actinic keratosis: Secondary | ICD-10-CM | POA: Diagnosis not present

## 2017-07-13 DIAGNOSIS — D239 Other benign neoplasm of skin, unspecified: Secondary | ICD-10-CM | POA: Diagnosis not present

## 2017-07-13 DIAGNOSIS — Z85828 Personal history of other malignant neoplasm of skin: Secondary | ICD-10-CM | POA: Diagnosis not present

## 2017-07-13 DIAGNOSIS — D229 Melanocytic nevi, unspecified: Secondary | ICD-10-CM | POA: Diagnosis not present

## 2017-08-24 ENCOUNTER — Encounter: Payer: Self-pay | Admitting: Internal Medicine

## 2017-11-30 DIAGNOSIS — C44311 Basal cell carcinoma of skin of nose: Secondary | ICD-10-CM | POA: Diagnosis not present

## 2018-02-08 ENCOUNTER — Other Ambulatory Visit: Payer: Self-pay | Admitting: Family Medicine

## 2018-02-08 DIAGNOSIS — Z1231 Encounter for screening mammogram for malignant neoplasm of breast: Secondary | ICD-10-CM

## 2018-02-09 ENCOUNTER — Ambulatory Visit
Admission: RE | Admit: 2018-02-09 | Discharge: 2018-02-09 | Disposition: A | Discharge status: Home / Self Care | Payer: Medicare Other | Source: Ambulatory Visit | Attending: Family Medicine | Admitting: Family Medicine

## 2018-02-09 DIAGNOSIS — Z1231 Encounter for screening mammogram for malignant neoplasm of breast: Secondary | ICD-10-CM | POA: Diagnosis not present

## 2018-03-25 ENCOUNTER — Ambulatory Visit: Payer: Medicare Other

## 2018-04-19 DIAGNOSIS — L570 Actinic keratosis: Secondary | ICD-10-CM | POA: Diagnosis not present

## 2018-05-04 DIAGNOSIS — Z1283 Encounter for screening for malignant neoplasm of skin: Secondary | ICD-10-CM | POA: Diagnosis not present

## 2018-05-04 DIAGNOSIS — L568 Other specified acute skin changes due to ultraviolet radiation: Secondary | ICD-10-CM | POA: Diagnosis not present

## 2018-05-04 DIAGNOSIS — L814 Other melanin hyperpigmentation: Secondary | ICD-10-CM | POA: Diagnosis not present

## 2018-05-04 DIAGNOSIS — Z85828 Personal history of other malignant neoplasm of skin: Secondary | ICD-10-CM | POA: Diagnosis not present

## 2018-05-04 DIAGNOSIS — D1809 Hemangioma of other sites: Secondary | ICD-10-CM | POA: Diagnosis not present

## 2018-05-04 DIAGNOSIS — D2270 Melanocytic nevi of unspecified lower limb, including hip: Secondary | ICD-10-CM | POA: Diagnosis not present

## 2018-05-04 DIAGNOSIS — D485 Neoplasm of uncertain behavior of skin: Secondary | ICD-10-CM | POA: Diagnosis not present

## 2018-05-04 DIAGNOSIS — L821 Other seborrheic keratosis: Secondary | ICD-10-CM | POA: Diagnosis not present

## 2018-05-04 DIAGNOSIS — D239 Other benign neoplasm of skin, unspecified: Secondary | ICD-10-CM | POA: Diagnosis not present

## 2018-05-04 DIAGNOSIS — D225 Melanocytic nevi of trunk: Secondary | ICD-10-CM | POA: Diagnosis not present

## 2018-05-04 DIAGNOSIS — L570 Actinic keratosis: Secondary | ICD-10-CM | POA: Diagnosis not present

## 2018-05-04 DIAGNOSIS — D2260 Melanocytic nevi of unspecified upper limb, including shoulder: Secondary | ICD-10-CM | POA: Diagnosis not present

## 2018-05-04 DIAGNOSIS — I781 Nevus, non-neoplastic: Secondary | ICD-10-CM | POA: Diagnosis not present

## 2018-05-08 DIAGNOSIS — R109 Unspecified abdominal pain: Secondary | ICD-10-CM

## 2018-05-08 HISTORY — DX: Unspecified abdominal pain: R10.9

## 2018-05-13 ENCOUNTER — Encounter: Payer: Self-pay | Admitting: Internal Medicine

## 2018-06-10 ENCOUNTER — Ambulatory Visit (AMBULATORY_SURGERY_CENTER): Payer: Self-pay

## 2018-06-10 VITALS — Ht 62.0 in | Wt 128.4 lb

## 2018-06-10 DIAGNOSIS — Z86010 Personal history of colonic polyps: Secondary | ICD-10-CM

## 2018-06-10 MED ORDER — NA SULFATE-K SULFATE-MG SULF 17.5-3.13-1.6 GM/177ML PO SOLN: 1.0000 | Freq: Once | ORAL | 0 refills | Status: AC

## 2018-06-10 NOTE — Progress Notes (Signed)
Per pt, no allergies to soy or egg products.Pt not taking any weight loss meds or using  O2 at home.  Pt refused emmi video. 

## 2018-06-24 ENCOUNTER — Encounter: Payer: Medicare Other | Admitting: Internal Medicine

## 2018-07-01 ENCOUNTER — Telehealth: Payer: Self-pay | Admitting: Family Medicine

## 2018-07-01 DIAGNOSIS — E7800 Pure hypercholesterolemia, unspecified: Secondary | ICD-10-CM

## 2018-07-01 DIAGNOSIS — E559 Vitamin D deficiency, unspecified: Secondary | ICD-10-CM

## 2018-07-01 DIAGNOSIS — M810 Age-related osteoporosis without current pathological fracture: Secondary | ICD-10-CM

## 2018-07-01 DIAGNOSIS — Z0000 Encounter for general adult medical examination without abnormal findings: Secondary | ICD-10-CM

## 2018-07-01 NOTE — Telephone Encounter (Signed)
When pt comes in for labs let her know that cbc/tsh (I routinely do for health mt) may not be covered by her insurance (medicare w/o an advantage plan)  Please cancel these 2 tests if she does not want to pay for them Thanks

## 2018-07-01 NOTE — Telephone Encounter (Signed)
-----   Message from Ellamae Sia sent at 06/24/2018 11:47 AM EDT ----- Regarding: lab orders for Friday, 3.27.20 Patient is scheduled for CPX labs, please order future labs, Thanks , Karna Christmas

## 2018-07-02 ENCOUNTER — Other Ambulatory Visit: Payer: Medicare Other

## 2018-07-06 ENCOUNTER — Encounter: Payer: Medicare Other | Admitting: Family Medicine

## 2018-07-06 ENCOUNTER — Ambulatory Visit: Payer: Medicare Other

## 2018-09-07 ENCOUNTER — Ambulatory Visit: Payer: Medicare Other

## 2018-09-16 ENCOUNTER — Encounter: Payer: Medicare Other | Admitting: Family Medicine

## 2018-11-18 ENCOUNTER — Other Ambulatory Visit: Payer: Medicare Other

## 2018-11-25 ENCOUNTER — Other Ambulatory Visit: Payer: Medicare Other

## 2018-11-25 ENCOUNTER — Ambulatory Visit: Payer: Medicare Other

## 2018-11-25 ENCOUNTER — Other Ambulatory Visit (INDEPENDENT_AMBULATORY_CARE_PROVIDER_SITE_OTHER): Payer: Medicare Other

## 2018-11-25 DIAGNOSIS — E7800 Pure hypercholesterolemia, unspecified: Secondary | ICD-10-CM | POA: Diagnosis not present

## 2018-11-25 DIAGNOSIS — Z0000 Encounter for general adult medical examination without abnormal findings: Secondary | ICD-10-CM | POA: Diagnosis not present

## 2018-11-25 DIAGNOSIS — M810 Age-related osteoporosis without current pathological fracture: Secondary | ICD-10-CM

## 2018-11-25 LAB — COMPREHENSIVE METABOLIC PANEL
ALT: 11 U/L (ref 0–35)
AST: 16 U/L (ref 0–37)
Albumin: 4.6 g/dL (ref 3.5–5.2)
Alkaline Phosphatase: 68 U/L (ref 39–117)
BUN: 18 mg/dL (ref 6–23)
CO2: 28 mEq/L (ref 19–32)
Calcium: 9.8 mg/dL (ref 8.4–10.5)
Chloride: 103 mEq/L (ref 96–112)
Creatinine, Ser: 0.77 mg/dL (ref 0.40–1.20)
GFR: 73.65 mL/min (ref >60.00)
Glucose, Bld: 95 mg/dL (ref 70–99)
Potassium: 5.2 mEq/L — ABNORMAL HIGH (ref 3.5–5.1)
Sodium: 139 mEq/L (ref 135–145)
Total Bilirubin: 1.1 mg/dL (ref 0.2–1.2)
Total Protein: 6.6 g/dL (ref 6.0–8.3)

## 2018-11-25 LAB — CBC WITH DIFFERENTIAL/PLATELET
Basophils Absolute: 0 10*3/uL (ref 0.0–0.1)
Basophils Relative: 0.8 % (ref 0.0–3.0)
Eosinophils Absolute: 0.1 10*3/uL (ref 0.0–0.7)
Eosinophils Relative: 1.4 % (ref 0.0–5.0)
HCT: 40.8 % (ref 36.0–46.0)
Hemoglobin: 13.7 g/dL (ref 12.0–15.0)
Lymphocytes Relative: 32.1 % (ref 12.0–46.0)
Lymphs Abs: 1.3 10*3/uL (ref 0.7–4.0)
MCHC: 33.6 g/dL (ref 30.0–36.0)
MCV: 95.2 fl (ref 78.0–100.0)
Monocytes Absolute: 0.3 10*3/uL (ref 0.1–1.0)
Monocytes Relative: 7.2 % (ref 3.0–12.0)
Neutro Abs: 2.5 10*3/uL (ref 1.4–7.7)
Neutrophils Relative %: 58.5 % (ref 43.0–77.0)
Platelets: 160 10*3/uL (ref 150.0–400.0)
RBC: 4.29 Mil/uL (ref 3.87–5.11)
RDW: 12.1 % (ref 11.5–15.5)
WBC: 4.2 10*3/uL (ref 4.0–10.5)

## 2018-11-25 LAB — LIPID PANEL
Cholesterol: 223 mg/dL — ABNORMAL HIGH (ref 0–200)
HDL: 73.1 mg/dL (ref >39.00)
LDL Cholesterol: 135 mg/dL — ABNORMAL HIGH (ref 0–99)
NonHDL: 150.14
Total CHOL/HDL Ratio: 3
Triglycerides: 75 mg/dL (ref 0.0–149.0)
VLDL: 15 mg/dL (ref 0.0–40.0)

## 2018-11-25 LAB — TSH: TSH: 1.57 u[IU]/mL (ref 0.35–4.50)

## 2018-11-25 LAB — VITAMIN D 25 HYDROXY (VIT D DEFICIENCY, FRACTURES): VITD: 44.16 ng/mL (ref 30.00–100.00)

## 2018-12-02 ENCOUNTER — Other Ambulatory Visit: Payer: Self-pay

## 2018-12-02 ENCOUNTER — Ambulatory Visit (INDEPENDENT_AMBULATORY_CARE_PROVIDER_SITE_OTHER): Payer: Medicare Other | Admitting: Family Medicine

## 2018-12-02 ENCOUNTER — Encounter: Payer: Self-pay | Admitting: Family Medicine

## 2018-12-02 VITALS — BP 122/68 | HR 69 | Temp 98.4°F | Ht 61.75 in | Wt 128.4 lb

## 2018-12-02 DIAGNOSIS — I872 Venous insufficiency (chronic) (peripheral): Secondary | ICD-10-CM | POA: Diagnosis not present

## 2018-12-02 DIAGNOSIS — Z1283 Encounter for screening for malignant neoplasm of skin: Secondary | ICD-10-CM | POA: Diagnosis not present

## 2018-12-02 DIAGNOSIS — M810 Age-related osteoporosis without current pathological fracture: Secondary | ICD-10-CM

## 2018-12-02 DIAGNOSIS — E559 Vitamin D deficiency, unspecified: Secondary | ICD-10-CM | POA: Diagnosis not present

## 2018-12-02 DIAGNOSIS — Z23 Encounter for immunization: Secondary | ICD-10-CM

## 2018-12-02 DIAGNOSIS — E2839 Other primary ovarian failure: Secondary | ICD-10-CM

## 2018-12-02 DIAGNOSIS — D2270 Melanocytic nevi of unspecified lower limb, including hip: Secondary | ICD-10-CM | POA: Diagnosis not present

## 2018-12-02 DIAGNOSIS — D2260 Melanocytic nevi of unspecified upper limb, including shoulder: Secondary | ICD-10-CM | POA: Diagnosis not present

## 2018-12-02 DIAGNOSIS — L570 Actinic keratosis: Secondary | ICD-10-CM | POA: Diagnosis not present

## 2018-12-02 DIAGNOSIS — Z85828 Personal history of other malignant neoplasm of skin: Secondary | ICD-10-CM | POA: Diagnosis not present

## 2018-12-02 DIAGNOSIS — Z0000 Encounter for general adult medical examination without abnormal findings: Secondary | ICD-10-CM | POA: Diagnosis not present

## 2018-12-02 DIAGNOSIS — E7800 Pure hypercholesterolemia, unspecified: Secondary | ICD-10-CM | POA: Diagnosis not present

## 2018-12-02 DIAGNOSIS — L821 Other seborrheic keratosis: Secondary | ICD-10-CM | POA: Diagnosis not present

## 2018-12-02 DIAGNOSIS — D1801 Hemangioma of skin and subcutaneous tissue: Secondary | ICD-10-CM | POA: Diagnosis not present

## 2018-12-02 DIAGNOSIS — Z853 Personal history of malignant neoplasm of breast: Secondary | ICD-10-CM | POA: Diagnosis not present

## 2018-12-02 DIAGNOSIS — L814 Other melanin hyperpigmentation: Secondary | ICD-10-CM | POA: Diagnosis not present

## 2018-12-02 DIAGNOSIS — D225 Melanocytic nevi of trunk: Secondary | ICD-10-CM | POA: Diagnosis not present

## 2018-12-02 DIAGNOSIS — D485 Neoplasm of uncertain behavior of skin: Secondary | ICD-10-CM | POA: Diagnosis not present

## 2018-12-02 NOTE — Assessment & Plan Note (Signed)
Due for 2 y dexa  Taking ca and D No falls or fx  Would like to avoid medication Nl D level  Disc exercise-continue walking

## 2018-12-02 NOTE — Patient Instructions (Addendum)
Don't forget to re schedule a colonoscopy   Flu shot today   If you are interested in the new shingles vaccine (Shingrix) - call your local pharmacy to check on coverage and availability  If affordable, get on a wait list at your pharmacy to get the vaccine.  Cholesterol is improved  Avoid red meat/ fried foods/ egg yolks/ fatty breakfast meats/ butter, cheese and high fat dairy/ and shellfish    Stay active  Go to the dermatologist as planned   When you check out - the staff will schedule a bone density test

## 2018-12-02 NOTE — Assessment & Plan Note (Signed)
Vitamin D level is therapeutic with current supplementation Disc importance of this to bone and overall health Level of 44  

## 2018-12-02 NOTE — Assessment & Plan Note (Signed)
dexa ordered

## 2018-12-02 NOTE — Assessment & Plan Note (Signed)
No re occurrence  Doing well  utd mammograms

## 2018-12-02 NOTE — Progress Notes (Signed)
Subjective:    Patient ID: Tina Mendez, female    DOB: 1946-04-02, 73 y.o.   MRN: [id number]  HPI Pt presents for amw and f/u of chronic medical problems   I have personally reviewed the Medicare Annual Wellness questionnaire and have noted 1. The patient's medical and social history 2. Their use of alcohol, tobacco or illicit drugs 3. Their current medications and supplements 4. The patient's functional ability including ADL's, fall risks, home safety risks and hearing or visual             impairment. 5. Diet and physical activities 6. Evidence for depression or mood disorders  The patients weight, height, BMI have been recorded in the chart and visual acuity is per eye clinic.  I have made referrals, counseling and provided education to the patient based review of the above and I have provided the pt with a written personalized care plan for preventive services. Reviewed and updated provider list, see scanned forms.  See scanned forms.  Routine anticipatory guidance given to patient.  See health maintenance. Colonoscopy was planned 3/19 -needs to re schedule (polyps)  screening mammogram 11/19  Personal h/o breast cancer (doing well)  Breast cancer screening Self breast exam-no lumps or changes  Flu vaccine- today Tetanus vaccine- Td 8/14 Pneumovax-completed Zoster vaccine- still thinking about that  Dexa 4/18  OP Falls-none  Fractures- none  Supplements-taking ca and D D level is 44 Exercise - regular exercise / walking  Advance directive- has living will and poa (husband)  Cognitive function addressed- see scanned forms- and if abnormal then additional documentation follows.  No issues with cognition at all   PMH and SH reviewed  Meds, vitals, and allergies reviewed.   ROS: See HPI.  Otherwise negative.    Lost mom in may - CHF/dm and 13 yo (was care giving) -a lot of falls  Also lost her best friend   BP Readings from Last 3 Encounters:  12/02/18 122/68   07/03/17 100/70  07/03/17 100/70   Pulse Readings from Last 3 Encounters:  12/02/18 69  07/03/17 78  07/03/17 78     Weight : Wt Readings from Last 3 Encounters:  12/02/18 128 lb 6 oz (58.2 kg)  06/10/18 128 lb 6.4 oz (58.2 kg)  07/03/17 141 lb (64 kg)  lost wt last year  She quit her sweets and started drinking water  23.67 kg/m   Hearing/vision:  Hearing Screening   125Hz  250Hz  500Hz  1000Hz  2000Hz  3000Hz  4000Hz  6000Hz  8000Hz   Right ear:   40 40 40  40    Left ear:   40 40 40  40    Vision Screening Comments: Pt had eye exam on 02/11/18 at [facility]   Hyperlipidemia Lab Results  Component Value Date   CHOL 223 (H) 11/25/2018   CHOL 243 (H) 06/26/2017   CHOL 238 (H) 05/30/2016   Lab Results  Component Value Date   HDL 73.10 11/25/2018   HDL 70.90 06/26/2017   HDL 72.40 05/30/2016   Lab Results  Component Value Date   LDLCALC 135 (H) 11/25/2018   LDLCALC 157 (H) 06/26/2017   LDLCALC 146 (H) 05/30/2016   Lab Results  Component Value Date   TRIG 75.0 11/25/2018   TRIG 76.0 06/26/2017   TRIG 97.0 05/30/2016   Lab Results  Component Value Date   CHOLHDL 3 11/25/2018   CHOLHDL 3 06/26/2017   CHOLHDL 3 05/30/2016   Lab Results  Component Value Date  LDLDIRECT 145.7 05/20/2013   LDLDIRECT 153.9 11/11/2012   LDLDIRECT 152.9 09/29/2008  has declined medication  Hereditary She quit eating [employer] chili -and LDL came down significantly   Lab Results  Component Value Date   CREATININE 0.77 11/25/2018   BUN 18 11/25/2018   NA 139 11/25/2018   K 5.2 (H) 11/25/2018   CL 103 11/25/2018   CO2 28 11/25/2018   Lab Results  Component Value Date   ALT 11 11/25/2018   AST 16 11/25/2018   ALKPHOS 68 11/25/2018   BILITOT 1.1 11/25/2018    Lab Results  Component Value Date   WBC 4.2 11/25/2018   HGB 13.7 11/25/2018   HCT 40.8 11/25/2018   MCV 95.2 11/25/2018   PLT 160.0 11/25/2018    Lab Results  Component Value Date   TSH 1.57  11/25/2018    Patient Active Problem List   Diagnosis Date Noted  . Allergic reaction 10/17/2016  . Estrogen deficiency 06/06/2016  . Routine general medical examination at a health care facility 05/24/2015  . Vitamin D deficiency 05/29/2014  . Encounter for routine gynecological examination 11/17/2012  . Encounter for Medicare annual wellness exam 11/10/2012  . PURE HYPERCHOLESTEROLEMIA 06/23/2008  . Osteoporosis 06/16/2008  . 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 Abdominal pain 05/2018   right lower abd pain/one time  . Basal cell carcinoma of skin    mainly on face  . Cancer ([place]) 2002   L BREAST/had mastectomy  . Colon polyp   . Hemorrhoids    hx of   . History of anal fissures   . Osteoporosis   . Plantar fasciitis    Past Surgical History:  Procedure Laterality Date  . Basal Cell Lesion  2018   removed from nose/ at [facility]  . Breast CA signs - Chemo  06/2000   no radiation  . BREAST CYST EXCISION  1970   rt  . [place]   1 time  . MASTECTOMY  2002   LEFT   Social History   Tobacco Use  . Smoking status: Never Smoker  . Smokeless tobacco: Never Used  Substance Use Topics  . Alcohol use: No    Alcohol/week: 0.0 standard drinks  . Drug use: No   Family History  Problem Relation Age of Onset  . Diabetes Mother   . Heart disease Mother   . Heart failure Mother   . Colon polyps Father   . Cancer Paternal Aunt        breast  . Cancer Paternal Uncle        Lung CA, smoker  . Colon cancer Neg Hx   . Breast cancer Neg Hx    Allergies  Allergen Reactions  . Sulfonamide Derivatives Nausea Only   Current Outpatient Medications on File Prior to Visit  Medication Sig Dispense Refill  . Calcium Carbonate-Vitamin D3 (CALCIUM 600-D) 600-400 MG-UNIT TABS Take one pill daily    . Cholecalciferol (VITAMIN D3) 1000 units CAPS daily. Take one pill daily     No current  facility-administered medications on file prior to visit.      Review of Systems  Constitutional: Negative for activity change, appetite change, fatigue, fever and unexpected weight change.  HENT: Negative for congestion, ear pain, rhinorrhea, sinus pressure and sore throat.   Eyes: Negative for pain, redness and visual disturbance.  Respiratory: Negative for cough, shortness of  breath and wheezing.   Cardiovascular: Negative for chest pain and palpitations.  Gastrointestinal: Negative for abdominal pain, blood in stool, constipation and diarrhea.  Endocrine: Negative for polydipsia and polyuria.  Genitourinary: Negative for dysuria, frequency and urgency.  Musculoskeletal: Negative for arthralgias, back pain and myalgias.  Skin: Negative for pallor and rash.  Allergic/Immunologic: Negative for environmental allergies.  Neurological: Negative for dizziness, syncope and headaches.  Hematological: Negative for adenopathy. Does not bruise/bleed easily.  Psychiatric/Behavioral: Negative for decreased concentration and dysphoric mood. The patient is not nervous/anxious.        Objective:   Physical Exam Constitutional:      General: She is not in acute distress.    Appearance: Normal appearance. She is well-developed and normal weight. She is not ill-appearing.  HENT:     Head: Normocephalic and atraumatic.     Right Ear: Tympanic membrane, ear canal and external ear normal.     Left Ear: Tympanic membrane, ear canal and external ear normal.     Nose: Nose normal.     Mouth/Throat:     Mouth: Mucous membranes are moist.     Pharynx: Oropharynx is clear. No posterior oropharyngeal erythema.  Eyes:     General: No scleral icterus.    Conjunctiva/sclera: Conjunctivae normal.     Pupils: Pupils are equal, round, and reactive to light.  Neck:     Musculoskeletal: Normal range of motion and neck supple. No neck rigidity or muscular tenderness.     Thyroid: No thyromegaly.     Vascular:  No carotid bruit or JVD.  Cardiovascular:     Rate and Rhythm: Normal rate and regular rhythm.     Heart sounds: Normal heart sounds. No gallop.   Pulmonary:     Effort: Pulmonary effort is normal. No respiratory distress.     Breath sounds: Normal breath sounds. No wheezing.  Chest:     Chest wall: No tenderness.  Abdominal:     General: Bowel sounds are normal. There is no distension or abdominal bruit.     Palpations: Abdomen is soft. There is no mass.     Tenderness: There is no abdominal tenderness.     Hernia: No hernia is present.  Genitourinary:    Comments: R breast:  Breast exam: No mass, nodules, thickening, tenderness, bulging, retraction, inflamation, nipple discharge or skin changes noted.  No axillary or clavicular LA.    L mastectomy site is w/o lumps or tenderness Musculoskeletal: Normal range of motion.        General: No tenderness.     Right lower leg: No edema.     Left lower leg: No edema.     Comments: No kyphosis   Lymphadenopathy:     Cervical: No cervical adenopathy.  Skin:    General: Skin is warm and dry.     Coloration: Skin is not pale.     Findings: No erythema or rash.     Comments: Solar lentigines diffusely    Neurological:     Mental Status: She is alert.     Cranial Nerves: No cranial nerve deficit.     Motor: No abnormal muscle tone.     Coordination: Coordination normal.     Gait: Gait normal.     Deep Tendon Reflexes: Reflexes are normal and symmetric. Reflexes normal.  Psychiatric:        Mood and Affect: Mood normal.        Cognition and Memory: Cognition and memory normal.  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 +    No re occurrence  Doing well  utd mammograms      Encounter for Medicare annual wellness exam - Primary    Reviewed health habits including diet and exercise and skin cancer prevention Reviewed appropriate screening tests for age  Also reviewed health mt list, fam hx and immunization status , as well as social and family history   See HPI Labs reviewed  Pt plans to re schedule her colonoscopy (polyps in the past) Flu shot today  Disc checking on coverage of shingrix Due for dexa-this was ordered  Pt has advance directive No cognitive concerns Nl hearing screen  utd eye care         Relevant Orders   Flu Vaccine QUAD 6+ mos PF IM (Fluarix Quad PF) (Completed)   Vitamin D deficiency    Vitamin D level is therapeutic with current supplementation Disc importance of this to bone and overall health Level of 44      Estrogen deficiency    dexa ordered      Relevant Orders   DG Bone Density    Other Visit Diagnoses    Need for influenza vaccination       Relevant Orders   Flu Vaccine QUAD 6+ mos PF IM (Fluarix Quad PF) (Completed)

## 2018-12-02 NOTE — Assessment & Plan Note (Signed)
Reviewed health habits including diet and exercise and skin cancer prevention Reviewed appropriate screening tests for age  Also reviewed health mt list, fam hx and immunization status , as well as social and family history   See HPI Labs reviewed  Pt plans to re schedule her colonoscopy (polyps in the past) Flu shot today  Disc checking on coverage of shingrix Due for dexa-this was ordered  Pt has advance directive No cognitive concerns Nl hearing screen  utd eye care

## 2018-12-02 NOTE — Assessment & Plan Note (Signed)
Some improvement in LDL with better diet Declines statin medication  Disc goals for lipids and reasons to control them Rev last labs with pt Rev low sat fat diet in detail

## 2018-12-27 ENCOUNTER — Other Ambulatory Visit: Payer: Self-pay

## 2018-12-27 ENCOUNTER — Ambulatory Visit (INDEPENDENT_AMBULATORY_CARE_PROVIDER_SITE_OTHER)
Admission: RE | Admit: 2018-12-27 | Discharge: 2018-12-27 | Disposition: A | Discharge status: Home / Self Care | Payer: Medicare Other | Source: Ambulatory Visit | Attending: Family Medicine | Admitting: Family Medicine

## 2018-12-27 DIAGNOSIS — E2839 Other primary ovarian failure: Secondary | ICD-10-CM

## 2019-02-03 ENCOUNTER — Other Ambulatory Visit: Payer: Self-pay | Admitting: Family Medicine

## 2019-02-03 DIAGNOSIS — Z1231 Encounter for screening mammogram for malignant neoplasm of breast: Secondary | ICD-10-CM

## 2019-03-02 ENCOUNTER — Telehealth: Payer: Self-pay

## 2019-03-02 ENCOUNTER — Ambulatory Visit (INDEPENDENT_AMBULATORY_CARE_PROVIDER_SITE_OTHER): Payer: Medicare Other | Admitting: Family Medicine

## 2019-03-02 ENCOUNTER — Encounter: Payer: Self-pay | Admitting: Family Medicine

## 2019-03-02 DIAGNOSIS — J069 Acute upper respiratory infection, unspecified: Secondary | ICD-10-CM | POA: Diagnosis not present

## 2019-03-02 NOTE — Assessment & Plan Note (Signed)
With cough and fever  Will get tested for covid today ([place] [facility]) OR go to urgent care for a visit  Disc symptom care (tylenol/ibuprofen) Fluids and rest and isolation until better and test returns  inst to call and update [place]  inst to go to ER if she suddenly worsens or develops shortness of breath

## 2019-03-02 NOTE — Telephone Encounter (Signed)
She was seen  

## 2019-03-02 NOTE — Patient Instructions (Signed)
Go to the [place] testing location or call [facility] to get tested for covid today  You can also go to an urgent care or [facility] (there you would be examined and seen as a patient)  Isolate yourself until test results return and your symptoms are gone Drink fluids Treat symptoms  Tylenol and ibuprofen are good for fever  Lots of rest  Please keep [place] posted   If symptoms suddenly worsen-go to the ER

## 2019-03-02 NOTE — Progress Notes (Signed)
Virtual Visit via Video Note  I connected with Tina Mendez on 03/02/19 at 11:15 AM EST by a video enabled telemedicine application and verified that I am speaking with the correct person using two identifiers.  Location: Patient: home Provider: office   Parties involved in encounter:   Patient : Tina Mendez Treating physician :   Loura Pardon MD    I discussed the limitations of evaluation and management by telemedicine and the availability of in person appointments. The patient expressed understanding and agreed to proceed.  History of Present Illness: Pt presents with uri symptoms and fever   Started with fatigue and aches yesterday  Developed a fever- could not sleep well   Head congestion  Mild drainage Cough is mild - thinks it is from drainage   This am she took tylenol at 9 pm -- temp was 101 an hour later  99.8 now  Drinking fluids- water  A little coffee this am   Decreased appetite - she made herself eat cereal this am  No loss of taste or smell   Her grandkids last weekend , no symptoms  Not a lot of public contact - grocery store   Patient Active Problem List   Diagnosis Date Noted  . URI (upper respiratory infection) 03/02/2019  . Allergic reaction 10/17/2016  . Estrogen deficiency 06/06/2016  . Routine general medical examination at a health care facility 05/24/2015  . Vitamin D deficiency 05/29/2014  . Encounter for routine gynecological examination 11/17/2012  . Encounter for Medicare annual wellness exam 11/10/2012  . PURE HYPERCHOLESTEROLEMIA 06/23/2008  . Osteoporosis 06/16/2008  . 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 Abdominal pain 05/2018   right lower abd pain/one time  . Basal cell carcinoma of skin    mainly on face  . Cancer ([place]) 2002   L BREAST/had mastectomy  . Colon polyp   . Hemorrhoids    hx of   . History of anal fissures   . Osteoporosis   . Plantar  fasciitis    Past Surgical History:  Procedure Laterality Date  . Basal Cell Lesion  2018   removed from nose/ at [facility]  . Breast CA signs - Chemo  06/2000   no radiation  . BREAST CYST EXCISION  1970   rt  . [place]   1 time  . MASTECTOMY  2002   LEFT   Social History   Tobacco Use  . Smoking status: Never Smoker  . Smokeless tobacco: Never Used  Substance Use Topics  . Alcohol use: No    Alcohol/week: 0.0 standard drinks  . Drug use: No   Family History  Problem Relation Age of Onset  . Diabetes Mother   . Heart disease Mother   . Heart failure Mother   . Colon polyps Father   . Cancer Paternal Aunt        breast  . Cancer Paternal Uncle        Lung CA, smoker  . Colon cancer Neg Hx   . Breast cancer Neg Hx    Allergies  Allergen Reactions  . Sulfonamide Derivatives Nausea Only   Current Outpatient Medications on File Prior to Visit  Medication Sig Dispense Refill  . Calcium Carbonate-Vitamin D3 (CALCIUM 600-D) 600-400 MG-UNIT TABS Take one pill daily    . Cholecalciferol (VITAMIN D3) 1000 units CAPS daily. Take one pill daily  No current facility-administered medications on file prior to visit.    Review of Systems  Constitutional: Positive for fever. Negative for chills and malaise/fatigue.  HENT: Positive for congestion and sore throat. Negative for ear pain and sinus pain.   Eyes: Negative for blurred vision, discharge and redness.  Respiratory: Positive for cough and sputum production. Negative for shortness of breath, wheezing and stridor.   Cardiovascular: Negative for chest pain, palpitations and leg swelling.  Gastrointestinal: Negative for abdominal pain, diarrhea, nausea and vomiting.  Musculoskeletal: Negative for myalgias.  Skin: Negative for rash.  Neurological: Negative for dizziness and headaches.    Observations/Objective: Patient appears well, in no distress (appears mildly fatigued) Weight is baseline  No  facial swelling or asymmetry Normal voice-not hoarse and no slurred speech No obvious tremor or mobility impairment Moving neck and UEs normally Able to hear the call well  No cough or shortness of breath during interview  Talkative and mentally sharp with no cognitive changes No skin changes on face or neck , no rash or pallor Affect is normal    Assessment and Plan: Problem List Items Addressed This Visit      Respiratory   URI (upper respiratory infection)    With cough and fever  Will get [facility] for covid today ([facility]) OR go to urgent care for a visit  Disc symptom care (tylenol/ibuprofen) Fluids and rest and isolation until better and test returns  inst to call and update [place]  inst to go to ER if she suddenly worsens or develops shortness of breath           Follow Up Instructions: Go to the [place] testing location or call [facility] to get tested for covid today  You can also go to an urgent care or [facility] (there you would be examined and seen as a patient)  Isolate yourself until test results return and your symptoms are gone Drink fluids Treat symptoms  Tylenol and ibuprofen are good for fever  Lots of rest  Please keep [place] posted   If symptoms suddenly worsen-go to the ER    I discussed the assessment and treatment plan with the patient. The patient was provided an opportunity to ask questions and all were answered. The patient agreed with the plan and demonstrated an understanding of the instructions.   The patient was advised to call back or seek an in-person evaluation if the symptoms worsen or if the condition fails to improve as anticipated.     Loura Pardon, MD

## 2019-03-02 NOTE — Telephone Encounter (Signed)
Starting last night pt began with fever; temp now is 101; head congestion,chills,achy all over,dry cough,drainage at back of throat, and fatigue; no travel or known exposure to + covid. Pt scheduled virtual with Dr Glori Bickers today at 11:15.pt will have vital signs ready.

## 2019-03-04 DIAGNOSIS — Z20828 Contact with and (suspected) exposure to other viral communicable diseases: Secondary | ICD-10-CM | POA: Diagnosis not present

## 2019-03-25 ENCOUNTER — Other Ambulatory Visit: Payer: Self-pay

## 2019-03-25 ENCOUNTER — Ambulatory Visit
Admission: RE | Admit: 2019-03-25 | Discharge: 2019-03-25 | Disposition: A | Discharge status: Home / Self Care | Payer: Medicare Other | Source: Ambulatory Visit | Attending: Family Medicine | Admitting: Family Medicine

## 2019-03-25 DIAGNOSIS — Z1231 Encounter for screening mammogram for malignant neoplasm of breast: Secondary | ICD-10-CM

## 2019-06-06 ENCOUNTER — Ambulatory Visit: Payer: Medicare Other | Attending: Internal Medicine

## 2019-06-06 DIAGNOSIS — Z23 Encounter for immunization: Secondary | ICD-10-CM

## 2019-06-06 NOTE — Progress Notes (Signed)
   Covid-19 Vaccination Clinic  Name:  Tina Mendez    MRN: [id number] DOB: Aug 06, 1945  06/06/2019  Ms. Parcell was observed post Covid-19 immunization for 15 minutes without incidence. She was provided with Vaccine Information Sheet and instruction to access the V-Safe system.   Ms. Jo was instructed to call 911 with any severe reactions post vaccine: Marland Kitchen Difficulty breathing  . Swelling of your face and throat  . A fast heartbeat  . A bad rash all over your body  . Dizziness and weakness    Immunizations Administered    Name Date Dose VIS Date Route   Pfizer COVID-19 Vaccine 06/06/2019  9:57 AM 0.3 mL 03/18/2019 Intramuscular   Manufacturer: [place]   Lot: [id number]   [place]: [id number]

## 2019-06-09 DIAGNOSIS — L814 Other melanin hyperpigmentation: Secondary | ICD-10-CM | POA: Diagnosis not present

## 2019-06-09 DIAGNOSIS — D1801 Hemangioma of skin and subcutaneous tissue: Secondary | ICD-10-CM | POA: Diagnosis not present

## 2019-06-09 DIAGNOSIS — Z1283 Encounter for screening for malignant neoplasm of skin: Secondary | ICD-10-CM | POA: Diagnosis not present

## 2019-06-09 DIAGNOSIS — L570 Actinic keratosis: Secondary | ICD-10-CM | POA: Diagnosis not present

## 2019-06-09 DIAGNOSIS — D2270 Melanocytic nevi of unspecified lower limb, including hip: Secondary | ICD-10-CM | POA: Diagnosis not present

## 2019-06-09 DIAGNOSIS — D225 Melanocytic nevi of trunk: Secondary | ICD-10-CM | POA: Diagnosis not present

## 2019-06-09 DIAGNOSIS — L578 Other skin changes due to chronic exposure to nonionizing radiation: Secondary | ICD-10-CM | POA: Diagnosis not present

## 2019-06-09 DIAGNOSIS — Z85828 Personal history of other malignant neoplasm of skin: Secondary | ICD-10-CM | POA: Diagnosis not present

## 2019-06-09 DIAGNOSIS — L821 Other seborrheic keratosis: Secondary | ICD-10-CM | POA: Diagnosis not present

## 2019-06-09 DIAGNOSIS — D2260 Melanocytic nevi of unspecified upper limb, including shoulder: Secondary | ICD-10-CM | POA: Diagnosis not present

## 2019-06-09 DIAGNOSIS — I872 Venous insufficiency (chronic) (peripheral): Secondary | ICD-10-CM | POA: Diagnosis not present

## 2019-07-05 ENCOUNTER — Ambulatory Visit: Payer: Medicare Other

## 2019-12-02 ENCOUNTER — Telehealth: Payer: Self-pay | Admitting: Family Medicine

## 2019-12-02 DIAGNOSIS — E875 Hyperkalemia: Secondary | ICD-10-CM | POA: Insufficient documentation

## 2019-12-02 DIAGNOSIS — E559 Vitamin D deficiency, unspecified: Secondary | ICD-10-CM

## 2019-12-02 DIAGNOSIS — M810 Age-related osteoporosis without current pathological fracture: Secondary | ICD-10-CM

## 2019-12-02 DIAGNOSIS — E7800 Pure hypercholesterolemia, unspecified: Secondary | ICD-10-CM

## 2019-12-02 NOTE — Telephone Encounter (Signed)
-----   Message from Ellamae Sia sent at 11/21/2019 10:28 AM EDT ----- Regarding: Lab orders for Monday, 8.30.21  AWV lab orders, please.

## 2019-12-05 ENCOUNTER — Other Ambulatory Visit: Payer: Self-pay

## 2019-12-05 ENCOUNTER — Other Ambulatory Visit (INDEPENDENT_AMBULATORY_CARE_PROVIDER_SITE_OTHER): Payer: Medicare Other

## 2019-12-05 ENCOUNTER — Ambulatory Visit: Payer: Medicare Other

## 2019-12-05 DIAGNOSIS — E875 Hyperkalemia: Secondary | ICD-10-CM

## 2019-12-05 DIAGNOSIS — E7800 Pure hypercholesterolemia, unspecified: Secondary | ICD-10-CM | POA: Diagnosis not present

## 2019-12-05 DIAGNOSIS — E559 Vitamin D deficiency, unspecified: Secondary | ICD-10-CM

## 2019-12-05 DIAGNOSIS — M810 Age-related osteoporosis without current pathological fracture: Secondary | ICD-10-CM

## 2019-12-05 LAB — COMPREHENSIVE METABOLIC PANEL
ALT: 11 U/L (ref 0–35)
AST: 17 U/L (ref 0–37)
Albumin: 4.2 g/dL (ref 3.5–5.2)
Alkaline Phosphatase: 62 U/L (ref 39–117)
BUN: 18 mg/dL (ref 6–23)
CO2: 27 mEq/L (ref 19–32)
Calcium: 9.5 mg/dL (ref 8.4–10.5)
Chloride: 106 mEq/L (ref 96–112)
Creatinine, Ser: 0.68 mg/dL (ref 0.40–1.20)
GFR: 84.76 mL/min (ref >60.00)
Glucose, Bld: 90 mg/dL (ref 70–99)
Potassium: 4.4 mEq/L (ref 3.5–5.1)
Sodium: 141 mEq/L (ref 135–145)
Total Bilirubin: 1 mg/dL (ref 0.2–1.2)
Total Protein: 6.3 g/dL (ref 6.0–8.3)

## 2019-12-05 LAB — VITAMIN D 25 HYDROXY (VIT D DEFICIENCY, FRACTURES): VITD: 38.55 ng/mL (ref 30.00–100.00)

## 2019-12-05 LAB — TSH: TSH: 1.77 u[IU]/mL (ref 0.35–4.50)

## 2019-12-05 LAB — LIPID PANEL
Cholesterol: 213 mg/dL — ABNORMAL HIGH (ref 0–200)
HDL: 66.5 mg/dL (ref >39.00)
LDL Cholesterol: 134 mg/dL — ABNORMAL HIGH (ref 0–99)
NonHDL: 146.87
Total CHOL/HDL Ratio: 3
Triglycerides: 62 mg/dL (ref 0.0–149.0)
VLDL: 12.4 mg/dL (ref 0.0–40.0)

## 2019-12-06 ENCOUNTER — Ambulatory Visit (INDEPENDENT_AMBULATORY_CARE_PROVIDER_SITE_OTHER): Payer: Medicare Other

## 2019-12-06 DIAGNOSIS — Z0000 Encounter for general adult medical examination without abnormal findings: Secondary | ICD-10-CM | POA: Diagnosis not present

## 2019-12-06 NOTE — Progress Notes (Signed)
Subjective:   Tina Mendez is a 74 y.o. female who presents for Medicare Annual (Subsequent) preventive examination.  Review of Systems: N/A      I connected with the patient today by telephone and verified that I am speaking with the correct person using two identifiers. Location patient: home Location nurse: work Persons participating in the telephone visit: patient, nurse.   I discussed the limitations, risks, security and privacy concerns of performing an evaluation and management service by telephone and the availability of in person appointments. I also discussed with the patient that there may be a patient responsible charge related to this service. The patient expressed understanding and verbally consented to this telephonic visit.        Cardiac Risk Factors include: advanced age (>8men, >70 women);Other (see comment), Risk factor comments: hypercholesterolemia     Objective:    Today's Vitals   There is no height or weight on file to calculate BMI.  Advanced Directives 12/06/2019 07/03/2017 06/06/2016  Does Patient Have a Medical Advance Directive? Yes Yes No  Type of Paramedic of [place];Living will [place];Living will -  Copy of [place] in Chart? No - copy requested No - copy requested -    Current Medications (verified) Outpatient Encounter Medications as of 12/06/2019  Medication Sig  . Calcium Carbonate-Vitamin D3 (CALCIUM 600-D) 600-400 MG-UNIT TABS Take one pill daily  . Cholecalciferol (VITAMIN D3) 1000 units CAPS daily. Take one pill daily   No facility-administered encounter medications on file as of 12/06/2019.    Allergies (verified) Sulfonamide derivatives   History: Past Medical History:  Diagnosis Date  . Abdominal pain 05/2018   right lower abd pain/one time  . Basal cell carcinoma of skin    mainly on face  . Cancer ([place]) 2002   L BREAST/had mastectomy  . Colon polyp     . Hemorrhoids    hx of   . History of anal fissures   . Osteoporosis   . Plantar fasciitis    Past Surgical History:  Procedure Laterality Date  . Basal Cell Lesion  2018   removed from nose/ at [facility]  . Breast CA signs - Chemo  06/2000   no radiation  . BREAST CYST EXCISION  1970   rt  . [place]   1 time  . MASTECTOMY  2002   LEFT   Family History  Problem Relation Age of Onset  . Diabetes Mother   . Heart disease Mother   . Heart failure Mother   . Colon polyps Father   . Cancer Paternal Aunt        breast  . Cancer Paternal Uncle        Lung CA, smoker  . Colon cancer Neg Hx   . Breast cancer Neg Hx    Social History   Socioeconomic History  . Marital status: Married    Spouse name: Not on file  . Number of children: Not on file  . Years of education: Not on file  . Highest education level: Not on file  Occupational History  . Occupation: [employer]    Employer: [employer]  Tobacco Use  . Smoking status: Never Smoker  . Smokeless tobacco: Never Used  Substance and Sexual Activity  . Alcohol use: No    Alcohol/week: 0.0 standard drinks  . Drug use: No  . Sexual activity: Never  Other Topics Concern  .  Not on file  Social History Narrative   Regular exercise:  No   Social Determinants of Health   Financial Resource Strain: Low Risk   . Difficulty of Paying Living Expenses: Not hard at all  Food Insecurity: No Food Insecurity  . Worried About Charity fundraiser in the Last Year: Never true  . Ran Out of Food in the Last Year: Never true  Transportation Needs: No Transportation Needs  . Lack of Transportation (Medical): No  . Lack of Transportation (Non-Medical): No  Physical Activity: Sufficiently Active  . Days of Exercise per Week: 5 days  . Minutes of Exercise per Session: 30 min  Stress: No Stress Concern Present  . Feeling of Stress : Not at all  Social Connections:   . Frequency of  Communication with Friends and Family: Not on file  . Frequency of Social Gatherings with Friends and Family: Not on file  . Attends Religious Services: Not on file  . Active Member of Clubs or Organizations: Not on file  . Attends Archivist Meetings: Not on file  . Marital Status: Not on file    Tobacco Counseling Counseling given: Not Answered   Clinical Intake:  Pre-visit preparation completed: Yes  Pain : No/denies pain     Nutritional Risks: None Diabetes: No  How often do you need to have someone help you when you read instructions, pamphlets, or other written materials from your doctor or pharmacy?: 1 - Never What is the last grade level you completed in school?: college grad  Diabetic: No Nutrition Risk Assessment:  Has the patient had any N/V/D within the last 2 months?  No  Does the patient have any non-healing wounds?  No  Has the patient had any unintentional weight loss or weight gain?  No   Diabetes:  Is the patient diabetic?  No  If diabetic, was a CBG obtained today?  N/A Did the patient bring in their glucometer from home?  N/A How often do you monitor your CBG's? N/A.   Financial Strains and Diabetes Management:  Are you having any financial strains with the device, your supplies or your medication? N/A.  Does the patient want to be seen by Chronic Care Management for management of their diabetes?  N/A Would the patient like to be referred to a Nutritionist or for Diabetic Management?  N/A    Interpreter Needed?: No  Information entered by :: CJohnson, LPNco   Activities of Daily Living In your present state of health, do you have any difficulty performing the following activities: 12/06/2019  Hearing? N  Vision? N  Difficulty concentrating or making decisions? N  Walking or climbing stairs? N  Dressing or bathing? N  Doing errands, shopping? N  Preparing Food and eating ? N  Using the Toilet? N  In the past six months, have  you accidently leaked urine? N  Do you have problems with loss of bowel control? N  Managing your Medications? N  Managing your Finances? N  Housekeeping or managing your Housekeeping? N  Some recent data might be hidden    Patient Care Team: Tower, Wynelle Fanny, MD as PCP - Maxine Glenn, MD (General Surgery) Magrinat, Virgie Dad, MD (Hematology and Oncology) Eula Flax, DO as Consulting Physician (Optometry)  Indicate any recent Medical Services you may have received from other than [facility] providers in the past year (date may be approximate).     Assessment:   This is a routine wellness examination  for [employer].  Hearing/Vision screen  Hearing Screening   125Hz  250Hz  500Hz  1000Hz  2000Hz  3000Hz  4000Hz  6000Hz  8000Hz   Right ear:           Left ear:           Vision Screening Comments: Patient gets annual eye exams.   Dietary issues and exercise activities discussed: Current Exercise Habits: Home exercise routine, Type of exercise: walking, Time (Minutes): 30, Frequency (Times/Week): 5, Weekly Exercise (Minutes/Week): 150, Intensity: Moderate, Exercise limited by: None identified  Goals    . Increase physical activity     Starting 06/06/16, I will continue to walk on treadmill 30 min 4-5 days per week.     . Increase physical activity     Starting 07/03/2017, I will continue to exercise for 30 minutes 5-7 days per week.     . Patient Stated     12/06/2019, I will continue to walk 5 days a week for 30 minutes.       Depression Screen PHQ 2/9 Scores 12/06/2019 12/02/2018 07/03/2017 06/06/2016 06/01/2015 05/29/2014 11/18/2012  PHQ - 2 Score 0 0 0 0 0 0 0  PHQ- 9 Score 0 - 0 - - - -    Fall Risk Fall Risk  12/06/2019 12/02/2018 07/03/2017 06/06/2016 06/01/2015  Falls in the past year? 0 0 No No No  Number falls in past yr: 0 0 - - -  Injury with Fall? 0 0 - - -  Risk for fall due to : Medication side effect - - - -  Follow up Falls evaluation completed;Falls prevention discussed  Falls evaluation completed - - -    Any stairs in or around the home? Yes  If so, are there any without handrails? No  Home free of loose throw rugs in walkways, pet beds, electrical cords, etc? Yes  Adequate lighting in your home to reduce risk of falls? Yes   ASSISTIVE DEVICES UTILIZED TO PREVENT FALLS:  Life alert? No  Use of a cane, walker or w/c? No  Grab bars in the bathroom? No  Shower chair or bench in shower? No  Elevated toilet seat or a handicapped toilet? No   TIMED UP AND GO:  Was the test performed? N/A, telephonic visit.    Cognitive Function: MMSE - Mini Mental State Exam 12/06/2019 07/03/2017 06/06/2016  Orientation to time 5 5 5   Orientation to Place 5 5 5   Registration 3 3 3   Attention/ Calculation 5 0 0  Recall 3 3 3   Language- name 2 objects - 0 0  Language- repeat 1 1 1   Language- follow 3 step command - 3 3  Language- read & follow direction - 0 0  Write a sentence - 0 0  Copy design - 0 0  Total score - 20 20  Mini Cog  Mini-Cog screen was completed. Maximum score is 22. A value of 0 denotes this part of the MMSE was not completed or the patient failed this part of the Mini-Cog screening.       Immunizations Immunization History  Administered Date(s) Administered  . Influenza,inj,Quad PF,6+ Mos 12/02/2018  . PFIZER SARS-COV-2 Vaccination 06/06/2019  . Pneumococcal Conjugate-13 06/01/2015  . Pneumococcal Polysaccharide-23 05/29/2014  . Td 01/27/2003, 11/17/2012    TDAP status: Up to date Flu Vaccine status: due, will discuss with provider Pneumococcal vaccine status: Up to date Covid-19 vaccine status: #2 due, received the first dose but had side effects and not sure if she will get the second one.  Qualifies for Shingles Vaccine? Yes   Zostavax completed No   Shingrix Completed?: No.    Education has been provided regarding the importance of this vaccine. Patient has been advised to call insurance company to determine out of pocket  expense if they have not yet received this vaccine. Advised may also receive vaccine at local pharmacy or Health Dept. Verbalized acceptance and understanding.  Screening Tests Health Maintenance  Topic Date Due  . COLONOSCOPY  09/10/2017  . COVID-19 Vaccine (2 - Pfizer 2-dose series) 06/27/2019  . INFLUENZA VACCINE  11/06/2019  . MAMMOGRAM  03/24/2020  . TETANUS/TDAP  11/18/2022  . DEXA SCAN  Completed  . Hepatitis C Screening  Completed  . PNA vac Low Risk Adult  Completed  . PAP SMEAR-Modifier  Discontinued    Health Maintenance  Health Maintenance Due  Topic Date Due  . COLONOSCOPY  09/10/2017  . COVID-19 Vaccine (2 - Pfizer 2-dose series) 06/27/2019  . INFLUENZA VACCINE  11/06/2019    Colorectal cancer screening: due, will complete at later date due to pandemic  Mammogram status: Completed 03/25/2019. Repeat every year Bone Density status: Completed 12/27/2018. Results reflect: Bone density results: OSTEOPOROSIS. Repeat every 2 years.  Lung Cancer Screening: (Low Dose CT Chest recommended if Age 42-80 years, 30 pack-year currently smoking OR have quit w/in 15years.) does not qualify.    Additional Screening:  Hepatitis C Screening: does qualify; Completed 06/06/2016  Vision Screening: Recommended annual ophthalmology exams for early detection of glaucoma and other disorders of the eye. Is the patient up to date with their annual eye exam?  Yes  Who is the provider or what is the name of the office in which the patient attends annual eye exams? [facility] in [place]  If pt is not established with a provider, would they like to be referred to a provider to establish care? No .   Dental Screening: Recommended annual dental exams for proper oral hygiene  Community Resource Referral / Chronic Care Management: CRR required this visit?  No   CCM required this visit?  No      Plan:     I have personally reviewed and noted the following in the patient's chart:    . Medical and social history . Use of alcohol, tobacco or illicit drugs  . Current medications and supplements . Functional ability and status . Nutritional status . Physical activity . Advanced directives . List of other physicians . Hospitalizations, surgeries, and ER visits in previous 12 months . Vitals . Screenings to include cognitive, depression, and falls . Referrals and appointments  In addition, I have reviewed and discussed with patient certain preventive protocols, quality metrics, and best practice recommendations. A written personalized care plan for preventive services as well as general preventive health recommendations were provided to patient.   Due to this being a telephonic visit, the after visit summary with patients personalized plan was offered to patient via mail or my-chart.  Patient preferred to pick up at office at next visit.   Andrez Grime, LPN   7/67/2094

## 2019-12-06 NOTE — Patient Instructions (Signed)
Tina Mendez , Thank you for taking time to come for your Medicare Wellness Visit. I appreciate your ongoing commitment to your health goals. Please review the following plan we discussed and let me know if I can assist you in the future.   Screening recommendations/referrals: Colonoscopy: due, will complete at later date due to pandemic Mammogram: Up to date, completed 03/25/2019, due 03/2020 Bone Density: Up to date, completed 12/27/2018, due 12/2020 Recommended yearly ophthalmology/optometry visit for glaucoma screening and checkup Recommended yearly dental visit for hygiene and checkup  Vaccinations: Influenza vaccine: due, will discuss with provider Pneumococcal vaccine: Completed series Tdap vaccine: Up to date, completed 11/17/2012, due 11/2022 Shingles vaccine: due, check with your insurance regarding coverage    Covid-19: #2 due, received the first dose but had side effects and not sure if she will get the second one.     Advanced directives: Please bring a copy of your POA (Power of Attorney) and/or Living Will to your next appointment.   Conditions/risks identified: hypercholesterolemia  Next appointment: Follow up in one year for your annual wellness visit    Preventive Care 70 Years and Older, Female Preventive care refers to lifestyle choices and visits with your health care provider that can promote health and wellness. What does preventive care include?  A yearly physical exam. This is also called an annual well check.  Dental exams once or twice a year.  Routine eye exams. Ask your health care provider how often you should have your eyes checked.  Personal lifestyle choices, including:  Daily care of your teeth and gums.  Regular physical activity.  Eating a healthy diet.  Avoiding tobacco and drug use.  Limiting alcohol use.  Practicing safe sex.  Taking low-dose aspirin every day.  Taking vitamin and mineral supplements as recommended by your health care  provider. What happens during an annual well check? The services and screenings done by your health care provider during your annual well check will depend on your age, overall health, lifestyle risk factors, and family history of disease. Counseling  Your health care provider may ask you questions about your:  Alcohol use.  Tobacco use.  Drug use.  Emotional well-being.  Home and relationship well-being.  Sexual activity.  Eating habits.  History of falls.  Memory and ability to understand (cognition).  Work and work Statistician.  Reproductive health. Screening  You may have the following tests or measurements:  Height, weight, and BMI.  Blood pressure.  Lipid and cholesterol levels. These may be checked every 5 years, or more frequently if you are over 58 years old.  Skin check.  Lung cancer screening. You may have this screening every year starting at age 77 if you have a 30-pack-year history of smoking and currently smoke or have quit within the past 15 years.  Fecal occult blood test (FOBT) of the stool. You may have this test every year starting at age 78.  Flexible sigmoidoscopy or colonoscopy. You may have a sigmoidoscopy every 5 years or a colonoscopy every 10 years starting at age 10.  Hepatitis C blood test.  Hepatitis B blood test.  Sexually transmitted disease (STD) testing.  Diabetes screening. This is done by checking your blood sugar (glucose) after you have not eaten for a while (fasting). You may have this done every 1-3 years.  Bone density scan. This is done to screen for osteoporosis. You may have this done starting at age 92.  Mammogram. This may be done every 1-2 years.  Talk to your health care provider about how often you should have regular mammograms. Talk with your health care provider about your test results, treatment options, and if necessary, the need for more tests. Vaccines  Your health care provider may recommend certain  vaccines, such as:  Influenza vaccine. This is recommended every year.  Tetanus, diphtheria, and acellular pertussis (Tdap, Td) vaccine. You may need a Td booster every 10 years.  Zoster vaccine. You may need this after age 48.  Pneumococcal 13-valent conjugate (PCV13) vaccine. One dose is recommended after age 63.  Pneumococcal polysaccharide (PPSV23) vaccine. One dose is recommended after age 13. Talk to your health care provider about which screenings and vaccines you need and how often you need them. This information is not intended to replace advice given to you by your health care provider. Make sure you discuss any questions you have with your health care provider. Document Released: 04/20/2015 Document Revised: 12/12/2015 Document Reviewed: 01/23/2015 Elsevier Interactive Patient Education  2017 [place] Prevention in the Home Falls can cause injuries. They can happen to people of all ages. There are many things you can do to make your home safe and to help prevent falls. What can I do on the outside of my home?  Regularly fix the edges of walkways and driveways and fix any cracks.  Remove anything that might make you trip as you walk through a door, such as a raised step or threshold.  Trim any bushes or trees on the path to your home.  Use bright outdoor lighting.  Clear any walking paths of anything that might make someone trip, such as rocks or tools.  Regularly check to see if handrails are loose or broken. Make sure that both sides of any steps have handrails.  Any raised decks and porches should have guardrails on the edges.  Have any leaves, snow, or ice cleared regularly.  Use sand or salt on walking paths during winter.  Clean up any spills in your garage right away. This includes oil or grease spills. What can I do in the bathroom?  Use night lights.  Install grab bars by the toilet and in the tub and shower. Do not use towel bars as grab  bars.  Use non-skid mats or decals in the tub or shower.  If you need to sit down in the shower, use a plastic, non-slip stool.  Keep the floor dry. Clean up any water that spills on the floor as soon as it happens.  Remove soap buildup in the tub or shower regularly.  Attach bath mats securely with double-sided non-slip rug tape.  Do not have throw rugs and other things on the floor that can make you trip. What can I do in the bedroom?  Use night lights.  Make sure that you have a light by your bed that is easy to reach.  Do not use any sheets or blankets that are too big for your bed. They should not hang down onto the floor.  Have a firm chair that has side arms. You can use this for support while you get dressed.  Do not have throw rugs and other things on the floor that can make you trip. What can I do in the kitchen?  Clean up any spills right away.  Avoid walking on wet floors.  Keep items that you use a lot in easy-to-reach places.  If you need to reach something above you, use a strong step stool  that has a grab bar.  Keep electrical cords out of the way.  Do not use floor polish or wax that makes floors slippery. If you must use wax, use non-skid floor wax.  Do not have throw rugs and other things on the floor that can make you trip. What can I do with my stairs?  Do not leave any items on the stairs.  Make sure that there are handrails on both sides of the stairs and use them. Fix handrails that are broken or loose. Make sure that handrails are as long as the stairways.  Check any carpeting to make sure that it is firmly attached to the stairs. Fix any carpet that is loose or worn.  Avoid having throw rugs at the top or bottom of the stairs. If you do have throw rugs, attach them to the floor with carpet tape.  Make sure that you have a light switch at the top of the stairs and the bottom of the stairs. If you do not have them, ask someone to add them for  you. What else can I do to help prevent falls?  Wear shoes that:  Do not have high heels.  Have rubber bottoms.  Are comfortable and fit you well.  Are closed at the toe. Do not wear sandals.  If you use a stepladder:  Make sure that it is fully opened. Do not climb a closed stepladder.  Make sure that both sides of the stepladder are locked into place.  Ask someone to hold it for you, if possible.  Clearly mark and make sure that you can see:  Any grab bars or handrails.  First and last steps.  Where the edge of each step is.  Use tools that help you move around (mobility aids) if they are needed. These include:  Canes.  Walkers.  Scooters.  Crutches.  Turn on the lights when you go into a dark area. Replace any light bulbs as soon as they burn out.  Set up your furniture so you have a clear path. Avoid moving your furniture around.  If any of your floors are uneven, fix them.  If there are any pets around you, be aware of where they are.  Review your medicines with your doctor. Some medicines can make you feel dizzy. This can increase your chance of falling. Ask your doctor what other things that you can do to help prevent falls. This information is not intended to replace advice given to you by your health care provider. Make sure you discuss any questions you have with your health care provider. Document Released: 01/18/2009 Document Revised: 08/30/2015 Document Reviewed: 04/28/2014 Elsevier Interactive Patient Education  2017 [employer].

## 2019-12-06 NOTE — Progress Notes (Signed)
PCP notes:  Health Maintenance: Flu- due COVID-  #2 due, received the first dose but had side effects and not sure if she will get the second one.   Colonoscopy- will complete at later date due to pandemic   Abnormal Screenings: none   Patient concerns: Vertigo since receiving first covid vaccine   Nurse concerns: none   Next PCP appt.: 12/07/2019 @ 9:30 am

## 2019-12-07 ENCOUNTER — Other Ambulatory Visit: Payer: Self-pay

## 2019-12-07 ENCOUNTER — Encounter: Payer: Self-pay | Admitting: Family Medicine

## 2019-12-07 ENCOUNTER — Ambulatory Visit (INDEPENDENT_AMBULATORY_CARE_PROVIDER_SITE_OTHER): Payer: Medicare Other | Admitting: Family Medicine

## 2019-12-07 VITALS — BP 124/68 | HR 79 | Temp 96.9°F | Ht 61.75 in | Wt 129.1 lb

## 2019-12-07 DIAGNOSIS — E538 Deficiency of other specified B group vitamins: Secondary | ICD-10-CM | POA: Insufficient documentation

## 2019-12-07 DIAGNOSIS — E7800 Pure hypercholesterolemia, unspecified: Secondary | ICD-10-CM

## 2019-12-07 DIAGNOSIS — Z853 Personal history of malignant neoplasm of breast: Secondary | ICD-10-CM | POA: Diagnosis not present

## 2019-12-07 DIAGNOSIS — M810 Age-related osteoporosis without current pathological fracture: Secondary | ICD-10-CM | POA: Diagnosis not present

## 2019-12-07 DIAGNOSIS — E559 Vitamin D deficiency, unspecified: Secondary | ICD-10-CM

## 2019-12-07 DIAGNOSIS — G629 Polyneuropathy, unspecified: Secondary | ICD-10-CM | POA: Insufficient documentation

## 2019-12-07 LAB — CBC WITH DIFFERENTIAL/PLATELET
Basophils Absolute: 0 10*3/uL (ref 0.0–0.1)
Basophils Relative: 0.9 % (ref 0.0–3.0)
Eosinophils Absolute: 0.1 10*3/uL (ref 0.0–0.7)
Eosinophils Relative: 1.3 % (ref 0.0–5.0)
HCT: 40.3 % (ref 36.0–46.0)
Hemoglobin: 13.8 g/dL (ref 12.0–15.0)
Lymphocytes Relative: 31.2 % (ref 12.0–46.0)
Lymphs Abs: 1.3 10*3/uL (ref 0.7–4.0)
MCHC: 34.3 g/dL (ref 30.0–36.0)
MCV: 95.2 fl (ref 78.0–100.0)
Monocytes Absolute: 0.3 10*3/uL (ref 0.1–1.0)
Monocytes Relative: 6.4 % (ref 3.0–12.0)
Neutro Abs: 2.5 10*3/uL (ref 1.4–7.7)
Neutrophils Relative %: 60.2 % (ref 43.0–77.0)
Platelets: 161 10*3/uL (ref 150.0–400.0)
RBC: 4.23 Mil/uL (ref 3.87–5.11)
RDW: 12.1 % (ref 11.5–15.5)
WBC: 4.1 10*3/uL (ref 4.0–10.5)

## 2019-12-07 LAB — VITAMIN B12: Vitamin B-12: 194 pg/mL — ABNORMAL LOW (ref 211–911)

## 2019-12-07 NOTE — Assessment & Plan Note (Signed)
Level in 67s Was not taking D daily-enc to take more regularly  Vitamin D level is therapeutic with current supplementation Disc importance of this to bone and overall health

## 2019-12-07 NOTE — Progress Notes (Signed)
Subjective:    Patient ID: Tina Mendez, female    DOB: 28-Dec-1945, 74 y.o.   MRN: [id number]  This visit occurred during the SARS-CoV-2 public health emergency.  Safety protocols were in place, including screening questions prior to the visit, additional usage of staff PPE, and extensive cleaning of exam room while observing appropriate contact time as indicated for disinfecting solutions.    HPI Pt presents for annual f/u of chronic medical problems   Wt Readings from Last 3 Encounters:  12/07/19 129 lb 1 oz (58.5 kg)  03/02/19 128 lb (58.1 kg)  12/02/18 128 lb 6 oz (58.2 kg)   23.80 kg/m Eats healthy and does exercise  Has cut back on sweets/bread   Had amw on 12/06/19  Noted she had first covid shot with side eff-not sure if she will get the 2nd  Had pfizer vaccine in march  Had a very bad vertigo afterwards - has come and go since then   She had covid in November  Fairly mild   Flu shot- got last year / will likely get one this fall Zoster status -interested   Colonoscopy 6/14 with 5 y recall  Holding off on next one due to pandemic Will do ifob kit   Mammogram 12/20 Personal hx of breast cancer on L with L mastectomy  Self breast exam - no lumps   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 Neuropathy  Has not had B12 checked    Hyperlipidemia  Lab Results  Component Value Date   CHOL 213 (H) 12/05/2019   CHOL  223 (H) 11/25/2018   CHOL 243 (H) 06/26/2017   Lab Results  Component Value Date   HDL 66.50 12/05/2019   HDL 73.10 11/25/2018   HDL 70.90 06/26/2017   Lab Results  Component Value Date   LDLCALC 134 (H) 12/05/2019   LDLCALC 135 (H) 11/25/2018   LDLCALC 157 (H) 06/26/2017   Lab Results  Component Value Date   TRIG 62.0 12/05/2019   TRIG 75.0 11/25/2018   TRIG 76.0 06/26/2017   Lab Results  Component Value Date   CHOLHDL 3 12/05/2019   CHOLHDL 3 11/25/2018   CHOLHDL 3 06/26/2017   Lab Results  Component Value Date   LDLDIRECT 145.7 05/20/2013   LDLDIRECT 153.9 11/11/2012   LDLDIRECT 152.9 09/29/2008  declines statin medication  Diet  Eats a lot of salmon  Also exercises  HDL is down    Cbc was not paid for  Will pay for out of pocket    Patient Active Problem List   Diagnosis Date Noted  . Neuropathy 12/07/2019  . Hyperkalemia 12/02/2019  . Allergic reaction 10/17/2016  . Estrogen deficiency 06/06/2016  . Routine general medical examination at a health care facility 05/24/2015  . Vitamin D deficiency 05/29/2014  .  Encounter for routine gynecological examination 11/17/2012  . Encounter for Medicare annual wellness exam 11/10/2012  . PURE HYPERCHOLESTEROLEMIA 06/23/2008  . Osteoporosis 06/16/2008  . 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 Abdominal pain 05/2018   right lower abd pain/one time  . Basal cell carcinoma of skin    mainly on face  . Cancer ([place]) 2002   L BREAST/had mastectomy  . Colon polyp   . Hemorrhoids    hx of   . History of anal fissures   . Osteoporosis   . Plantar fasciitis    Past Surgical History:  Procedure Laterality Date  . Basal Cell Lesion  2018   removed from nose/ at [facility]  . Breast CA signs - Chemo  06/2000   no radiation  . BREAST CYST EXCISION  1970   rt  . Helix   1 time  . MASTECTOMY  2002   LEFT   Social History    Tobacco Use  . Smoking status: Never Smoker  . Smokeless tobacco: Never Used  Substance Use Topics  . Alcohol use: No    Alcohol/week: 0.0 standard drinks  . Drug use: No   Family History  Problem Relation Age of Onset  . Diabetes Mother   . Heart disease Mother   . Heart failure Mother   . Colon polyps Father   . Cancer Paternal Aunt        breast  . Cancer Paternal Uncle        Lung CA, smoker  . Colon cancer Neg Hx   . Breast cancer Neg Hx    Allergies  Allergen Reactions  . Sulfonamide Derivatives Nausea Only   Current Outpatient Medications on File Prior to Visit  Medication Sig Dispense Refill  . Calcium Carbonate-Vitamin D3 (CALCIUM 600-D) 600-400 MG-UNIT TABS Take one pill daily    . Cholecalciferol (VITAMIN D3) 1000 units CAPS daily. Take one pill daily     No current facility-administered medications on file prior to visit.    Review of Systems  Constitutional: Negative for activity change, appetite change, fatigue, fever and unexpected weight change.  HENT: Negative for congestion, ear pain, rhinorrhea, sinus pressure and sore throat.   Eyes: Negative for pain, redness and visual disturbance.  Respiratory: Negative for cough, shortness of breath and wheezing.   Cardiovascular: Negative for chest pain and palpitations.  Gastrointestinal: Negative for abdominal pain, blood in stool, constipation and diarrhea.  Endocrine: Negative for polydipsia and polyuria.  Genitourinary: Negative for dysuria, frequency and urgency.  Musculoskeletal: Negative for arthralgias, back pain and myalgias.  Skin: Negative for pallor and rash.  Allergic/Immunologic: Negative for environmental allergies.  Neurological: Negative for dizziness, syncope and headaches.       Tingling in toes   Hematological: Negative for adenopathy. Does not bruise/bleed easily.  Psychiatric/Behavioral: Negative for decreased concentration and dysphoric mood. The patient is not nervous/anxious.         Objective:   Physical Exam Constitutional:      General: She is not in acute distress.    Appearance: Normal appearance. She is well-developed and normal weight. She is not ill-appearing or diaphoretic.  HENT:     Head: Normocephalic and atraumatic.     Right Ear: Tympanic membrane, ear canal and external ear normal.     Left Ear: Tympanic membrane, ear canal and external ear normal.     Nose:  Nose normal. No congestion.     Mouth/Throat:     Mouth: Mucous membranes are moist.     Pharynx: Oropharynx is clear. No posterior oropharyngeal erythema.  Eyes:     General: No scleral icterus.    Extraocular Movements: Extraocular movements intact.     Conjunctiva/sclera: Conjunctivae normal.     Pupils: Pupils are equal, round, and reactive to light.  Neck:     Thyroid: No thyromegaly.     Vascular: No carotid bruit or JVD.  Cardiovascular:     Rate and Rhythm: Normal rate and regular rhythm.     Pulses: Normal pulses.     Heart sounds: Normal heart sounds. No gallop.   Pulmonary:     Effort: Pulmonary effort is normal. No respiratory distress.     Breath sounds: Normal breath sounds. No wheezing.     Comments: Good air exch Chest:     Chest wall: No tenderness.  Abdominal:     General: Bowel sounds are normal. There is no distension or abdominal bruit.     Palpations: Abdomen is soft. There is no mass.     Tenderness: There is no abdominal tenderness.     Hernia: No hernia is present.  Genitourinary:    Comments: Breast exam: No mass, nodules, thickening, tenderness, bulging, retraction, inflamation, nipple discharge or skin changes noted.  No axillary or clavicular LA.     Musculoskeletal:        General: No tenderness. Normal range of motion.     Cervical back: Normal range of motion and neck supple. No rigidity. No muscular tenderness.     Right lower leg: No edema.     Left lower leg: No edema.     Comments: No kyphosis   Lymphadenopathy:     Cervical: No cervical  adenopathy.  Skin:    General: Skin is warm and dry.     Coloration: Skin is not pale.     Findings: No erythema or rash.     Comments: Solar lentigines diffusely Mildly tanned  Neurological:     Mental Status: She is alert. Mental status is at baseline.     Cranial Nerves: No cranial nerve deficit.     Motor: No abnormal muscle tone.     Coordination: Coordination normal.     Gait: Gait normal.     Deep Tendon Reflexes: Reflexes are normal and symmetric. Reflexes normal.  Psychiatric:        Mood and Affect: Mood normal.        Cognition and Memory: Cognition and memory normal.           Assessment & Plan:   Problem List Items Addressed This Visit      Nervous and Auditory   Neuropathy    Pt c/o tingling and altered sensation in toes /bottom of feet Suspect periph neuropathy  Nl TSH and glucose  Checking B12 and cbc today       Relevant Orders   CBC with Differential/Platelet (Completed)   Vitamin B12 (Completed)     Musculoskeletal and Integument   Osteoporosis    Had dexa 9/20 No falls or fx  D level low normal- she will go back to taking vitamin D daily  Good exercise         Other   PURE HYPERCHOLESTEROLEMIA - Primary    Disc goals for lipids and reasons to control them Rev last labs with pt Rev low sat fat diet in detail  Stable  LDL in the 130s with good HDL Declines tx with medication  Diet is generally good       hx: breast cancer, left IDC, 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

## 2019-12-07 NOTE — Patient Instructions (Addendum)
If you are interested in the new shingles vaccine (Shingrix) - call your local pharmacy to check on coverage and availability  If affordable, get on a wait list at your pharmacy to get the vaccine.  Please do the ifob kit for colon cancer screening  Let us know if /when you are ready to schedule a colonoscopy    Labs for cbc and B12 today

## 2019-12-07 NOTE — Assessment & Plan Note (Signed)
Disc goals for lipids and reasons to control them Rev last labs with pt Rev low sat fat diet in detail  Stable LDL in the 130s with good HDL Declines tx with medication  Diet is generally good

## 2019-12-07 NOTE — Assessment & Plan Note (Signed)
No clinical changes -doing well

## 2019-12-07 NOTE — Assessment & Plan Note (Signed)
Had dexa 9/20 No falls or fx  D level low normal- she will go back to taking vitamin D daily  Good exercise

## 2019-12-07 NOTE — Assessment & Plan Note (Signed)
Pt c/o tingling and altered sensation in toes /bottom of feet Suspect periph neuropathy  Nl TSH and glucose  Checking B12 and cbc today

## 2019-12-13 ENCOUNTER — Other Ambulatory Visit: Payer: Self-pay

## 2019-12-13 ENCOUNTER — Ambulatory Visit (INDEPENDENT_AMBULATORY_CARE_PROVIDER_SITE_OTHER): Payer: Medicare Other

## 2019-12-13 DIAGNOSIS — E538 Deficiency of other specified B group vitamins: Secondary | ICD-10-CM

## 2019-12-13 MED ORDER — CYANOCOBALAMIN 1000 MCG/ML IJ SOLN
1000.0000 ug | Freq: Once | INTRAMUSCULAR | Status: AC
  Administered 2019-12-13: 1000 ug via INTRAMUSCULAR

## 2019-12-13 NOTE — Progress Notes (Signed)
Per orders of Dr. Glori Bickers, injection of B12 given by Randall An. Patient tolerated injection well.

## 2019-12-14 ENCOUNTER — Other Ambulatory Visit: Payer: Self-pay | Admitting: Family Medicine

## 2019-12-14 ENCOUNTER — Other Ambulatory Visit (INDEPENDENT_AMBULATORY_CARE_PROVIDER_SITE_OTHER): Payer: Medicare Other

## 2019-12-14 DIAGNOSIS — Z1211 Encounter for screening for malignant neoplasm of colon: Secondary | ICD-10-CM | POA: Diagnosis not present

## 2019-12-14 DIAGNOSIS — L814 Other melanin hyperpigmentation: Secondary | ICD-10-CM | POA: Diagnosis not present

## 2019-12-14 DIAGNOSIS — Z85828 Personal history of other malignant neoplasm of skin: Secondary | ICD-10-CM | POA: Diagnosis not present

## 2019-12-14 DIAGNOSIS — D485 Neoplasm of uncertain behavior of skin: Secondary | ICD-10-CM | POA: Diagnosis not present

## 2019-12-14 DIAGNOSIS — D2260 Melanocytic nevi of unspecified upper limb, including shoulder: Secondary | ICD-10-CM | POA: Diagnosis not present

## 2019-12-14 DIAGNOSIS — D225 Melanocytic nevi of trunk: Secondary | ICD-10-CM | POA: Diagnosis not present

## 2019-12-14 DIAGNOSIS — L570 Actinic keratosis: Secondary | ICD-10-CM | POA: Diagnosis not present

## 2019-12-14 DIAGNOSIS — L578 Other skin changes due to chronic exposure to nonionizing radiation: Secondary | ICD-10-CM | POA: Diagnosis not present

## 2019-12-14 DIAGNOSIS — D2270 Melanocytic nevi of unspecified lower limb, including hip: Secondary | ICD-10-CM | POA: Diagnosis not present

## 2019-12-14 DIAGNOSIS — Z1283 Encounter for screening for malignant neoplasm of skin: Secondary | ICD-10-CM | POA: Diagnosis not present

## 2019-12-14 DIAGNOSIS — L821 Other seborrheic keratosis: Secondary | ICD-10-CM | POA: Diagnosis not present

## 2019-12-14 DIAGNOSIS — D1801 Hemangioma of skin and subcutaneous tissue: Secondary | ICD-10-CM | POA: Diagnosis not present

## 2019-12-14 LAB — FECAL OCCULT BLOOD, IMMUNOCHEMICAL: Fecal Occult Bld: NEGATIVE

## 2019-12-15 ENCOUNTER — Ambulatory Visit: Payer: Medicare Other

## 2019-12-21 ENCOUNTER — Ambulatory Visit (INDEPENDENT_AMBULATORY_CARE_PROVIDER_SITE_OTHER): Payer: Medicare Other

## 2019-12-21 ENCOUNTER — Other Ambulatory Visit: Payer: Self-pay

## 2019-12-21 DIAGNOSIS — E538 Deficiency of other specified B group vitamins: Secondary | ICD-10-CM | POA: Diagnosis not present

## 2019-12-21 MED ORDER — CYANOCOBALAMIN 1000 MCG/ML IJ SOLN
1000.0000 ug | Freq: Once | INTRAMUSCULAR | Status: AC
  Administered 2019-12-21: 1000 ug via INTRAMUSCULAR

## 2019-12-21 NOTE — Progress Notes (Signed)
Per orders of Dr. Glori Bickers, injection of B12 given in the left deltoid,  by Loreen Freud. Patient tolerated injection well.

## 2019-12-27 ENCOUNTER — Ambulatory Visit (INDEPENDENT_AMBULATORY_CARE_PROVIDER_SITE_OTHER): Payer: Medicare Other | Admitting: *Deleted

## 2019-12-27 ENCOUNTER — Other Ambulatory Visit: Payer: Self-pay

## 2019-12-27 DIAGNOSIS — E538 Deficiency of other specified B group vitamins: Secondary | ICD-10-CM

## 2019-12-27 MED ORDER — CYANOCOBALAMIN 1000 MCG/ML IJ SOLN
1000.0000 ug | Freq: Once | INTRAMUSCULAR | Status: AC
  Administered 2019-12-27: 1000 ug via INTRAMUSCULAR

## 2019-12-27 NOTE — Progress Notes (Signed)
Per orders of Dr. Glori Bickers, injection of Vitamin B12 given (Left Deltoid) by Lauralyn Primes. Patient tolerated injection well.

## 2020-01-03 ENCOUNTER — Ambulatory Visit (INDEPENDENT_AMBULATORY_CARE_PROVIDER_SITE_OTHER): Payer: Medicare Other

## 2020-01-03 ENCOUNTER — Other Ambulatory Visit: Payer: Self-pay

## 2020-01-03 DIAGNOSIS — E538 Deficiency of other specified B group vitamins: Secondary | ICD-10-CM

## 2020-01-03 MED ORDER — CYANOCOBALAMIN 1000 MCG/ML IJ SOLN
1000.0000 ug | Freq: Once | INTRAMUSCULAR | Status: AC
  Administered 2020-01-03: 1000 ug via INTRAMUSCULAR

## 2020-01-03 NOTE — Progress Notes (Signed)
Per orders of Dr. Biagio Borg in absence of Dr. Thea Alken, injection of vitamin b12 given by Kem Parkinson. Patient tolerated injection well.

## 2020-01-08 IMAGING — MG DIGITAL SCREENING UNILAT RIGHT W/ TOMO W/ CAD
6 series · 6 of 18 positions shown · non-contrast
Comparison: Previous exam(s).

CLINICAL DATA: Screening.

EXAM:
DIGITAL SCREENING UNILATERAL RIGHT MAMMOGRAM WITH CAD AND TOMO

[R XCCL synth-2D]
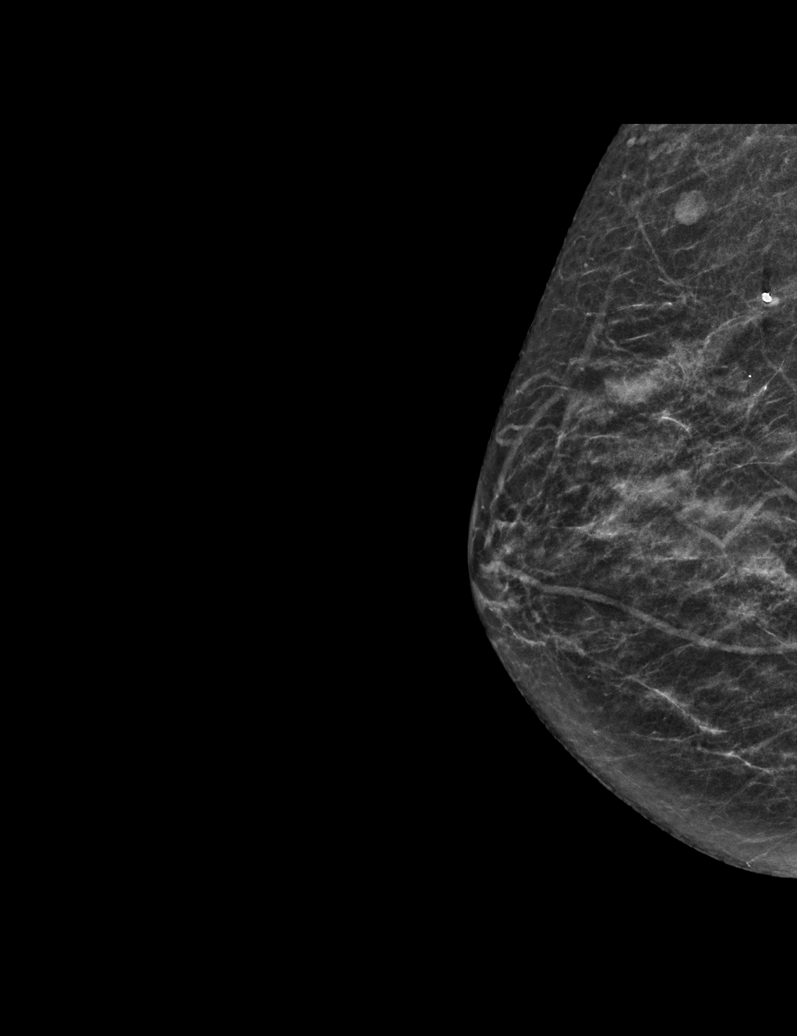

[R MLO synth-2D]
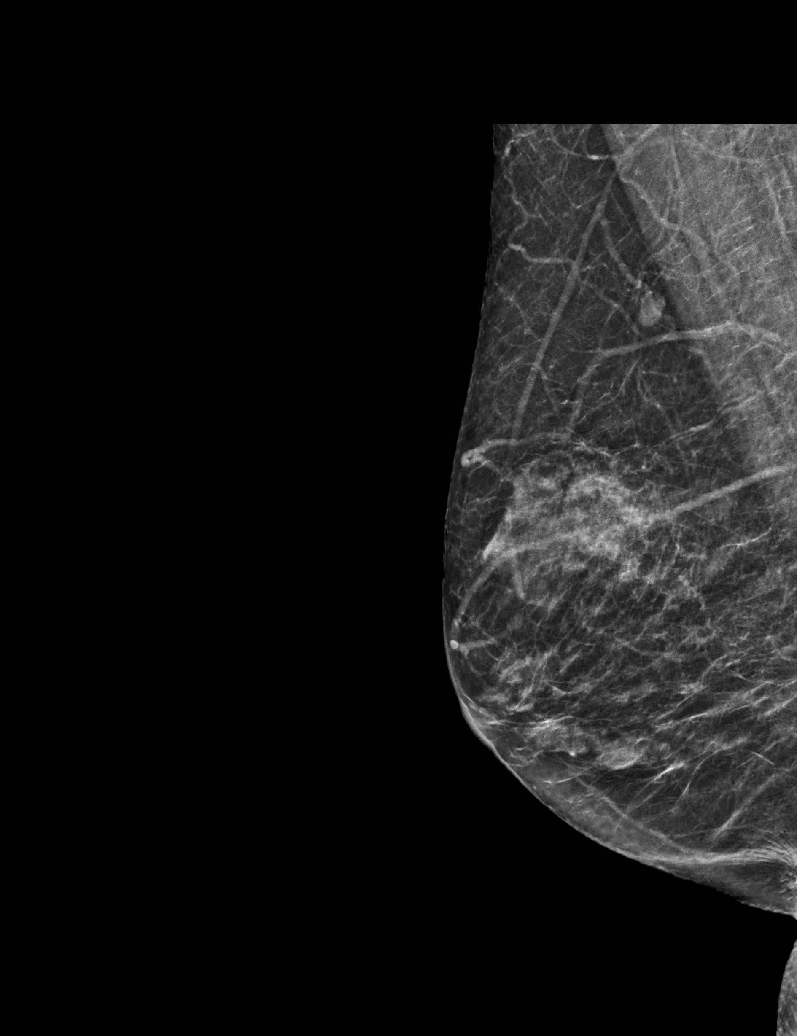

[R CC synth-2D]
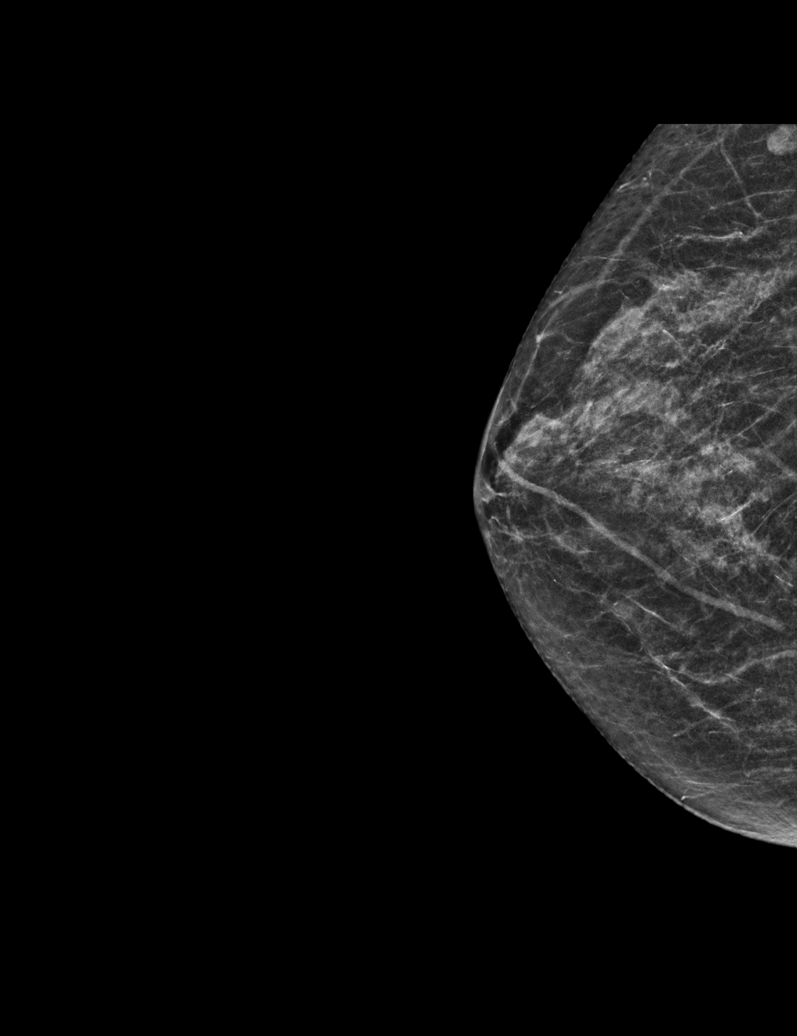

[R CC tomo · tomo slice 23/44.0]
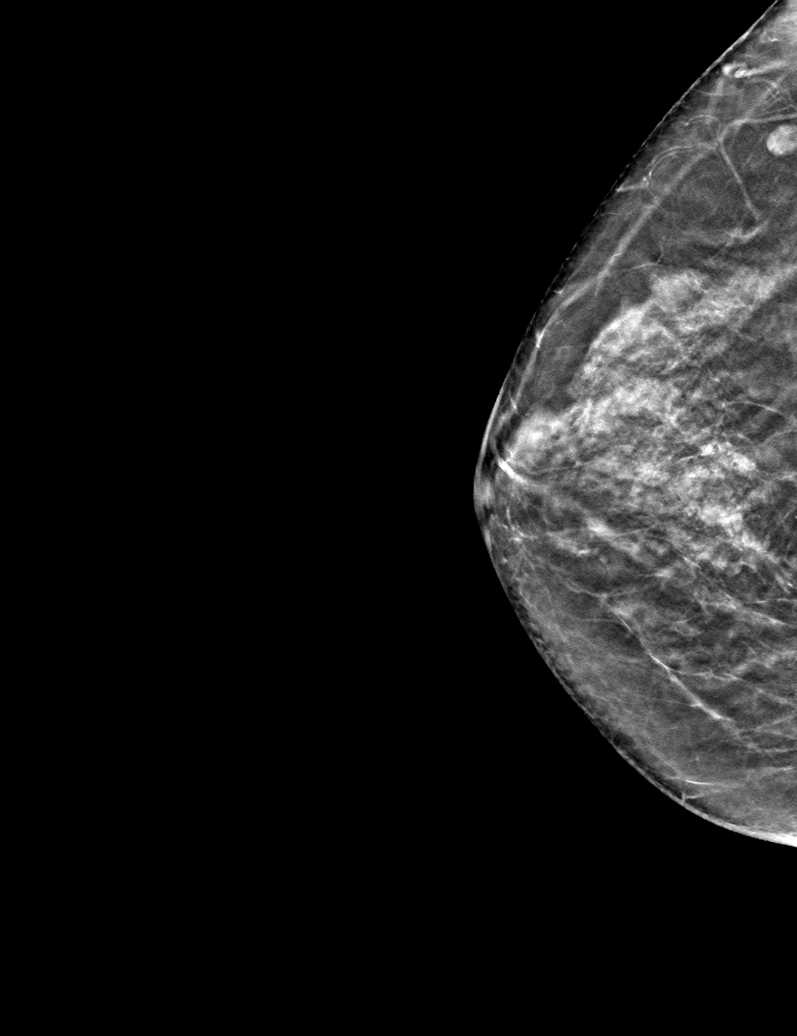

[R MLO tomo · tomo slice 23/44.0]
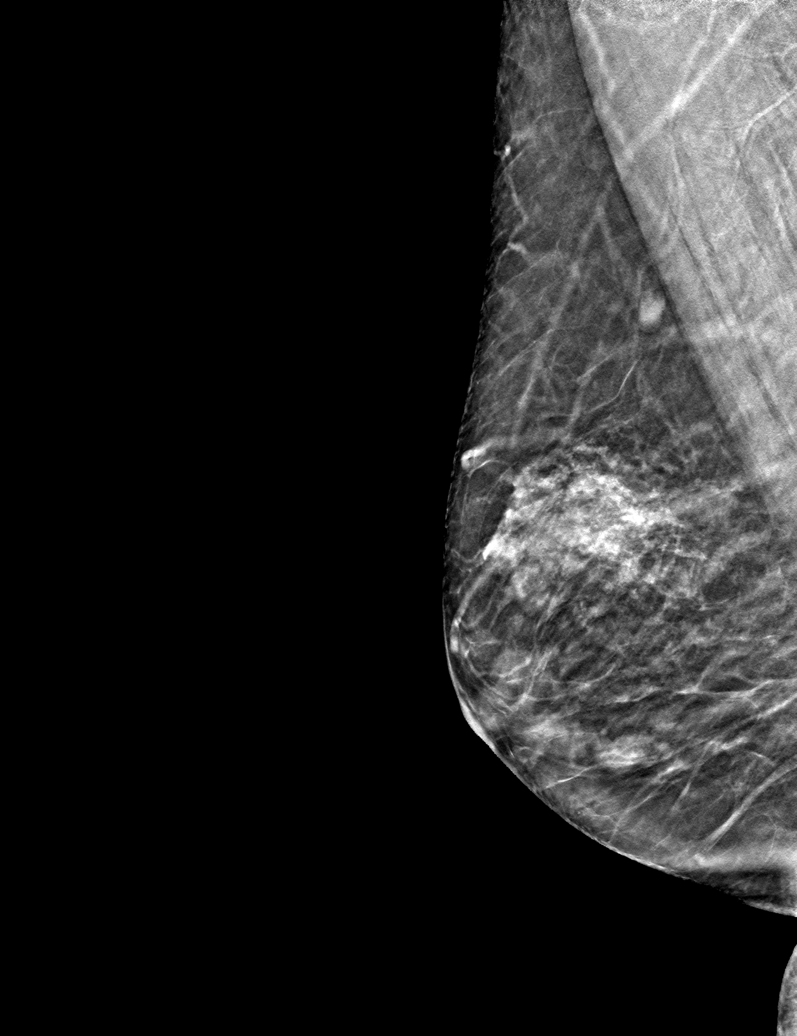

[R XCCL tomo · tomo slice 23/44.0]
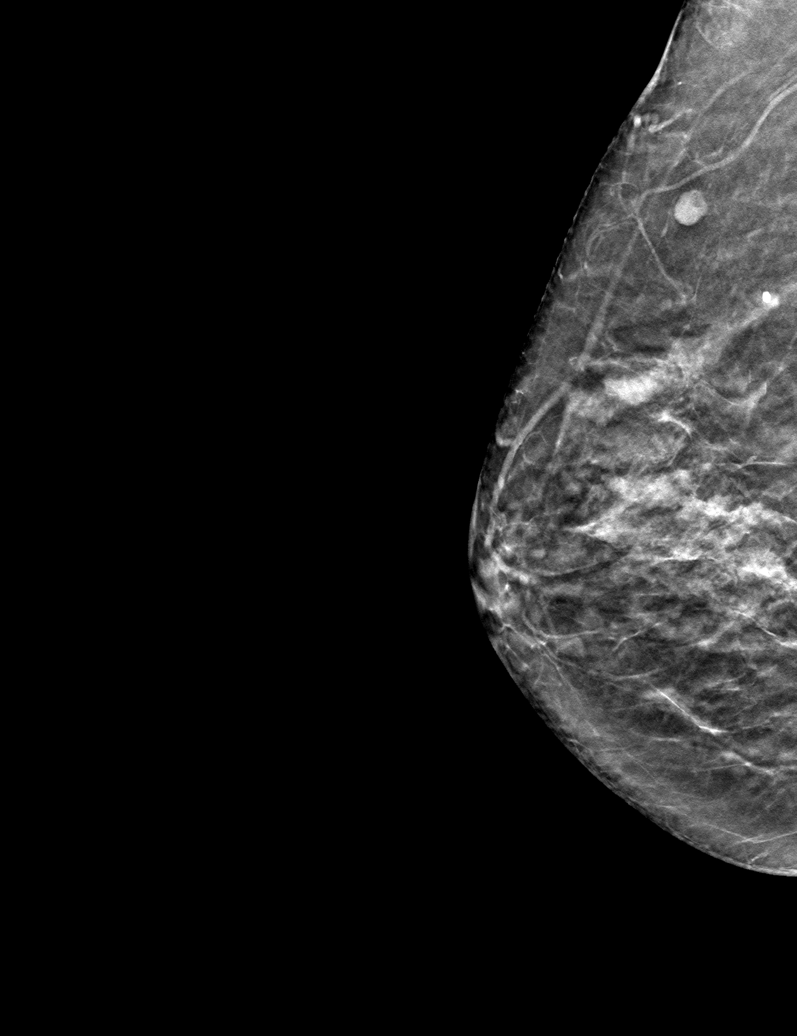

[6 of 18 positions shown; findings below may reference images not displayed]

ACR Breast Density Category c: The breast tissue is heterogeneously
dense, which may obscure small masses.
FINDINGS: There are no findings suspicious for malignancy. Images were
processed with CAD.
IMPRESSION: No mammographic evidence of malignancy. A result letter of this
screening mammogram will be mailed directly to the patient.

RECOMMENDATION:
Screening mammogram in one year. (Code:[id number])

BI-RADS CATEGORY  1: Negative.

## 2020-03-15 DIAGNOSIS — D0439 Carcinoma in situ of skin of other parts of face: Secondary | ICD-10-CM | POA: Diagnosis not present

## 2020-05-25 ENCOUNTER — Other Ambulatory Visit: Payer: Self-pay | Admitting: Family Medicine

## 2020-05-25 DIAGNOSIS — Z1231 Encounter for screening mammogram for malignant neoplasm of breast: Secondary | ICD-10-CM

## 2020-05-29 ENCOUNTER — Encounter: Payer: Self-pay | Admitting: Internal Medicine

## 2020-06-02 ENCOUNTER — Ambulatory Visit
Admission: RE | Admit: 2020-06-02 | Discharge: 2020-06-02 | Disposition: A | Discharge status: Home / Self Care | Payer: Medicare Other | Source: Ambulatory Visit | Attending: Family Medicine | Admitting: Family Medicine

## 2020-06-02 ENCOUNTER — Inpatient Hospital Stay: Admission: RE | Admit: 2020-06-02 | Payer: Medicare Other | Source: Ambulatory Visit

## 2020-06-02 ENCOUNTER — Other Ambulatory Visit: Payer: Self-pay

## 2020-06-02 DIAGNOSIS — Z1231 Encounter for screening mammogram for malignant neoplasm of breast: Secondary | ICD-10-CM | POA: Diagnosis not present

## 2020-06-06 ENCOUNTER — Other Ambulatory Visit: Payer: Self-pay | Admitting: Family Medicine

## 2020-06-06 DIAGNOSIS — R928 Other abnormal and inconclusive findings on diagnostic imaging of breast: Secondary | ICD-10-CM

## 2020-06-07 ENCOUNTER — Ambulatory Visit
Admission: RE | Admit: 2020-06-07 | Discharge: 2020-06-07 | Disposition: A | Discharge status: Home / Self Care | Payer: Medicare Other | Source: Ambulatory Visit | Attending: Family Medicine | Admitting: Family Medicine

## 2020-06-07 ENCOUNTER — Other Ambulatory Visit: Payer: Self-pay | Admitting: Family Medicine

## 2020-06-07 ENCOUNTER — Other Ambulatory Visit: Payer: Self-pay

## 2020-06-07 DIAGNOSIS — R928 Other abnormal and inconclusive findings on diagnostic imaging of breast: Secondary | ICD-10-CM

## 2020-06-07 DIAGNOSIS — R921 Mammographic calcification found on diagnostic imaging of breast: Secondary | ICD-10-CM | POA: Diagnosis not present

## 2020-06-14 ENCOUNTER — Other Ambulatory Visit: Payer: Self-pay

## 2020-06-14 ENCOUNTER — Ambulatory Visit
Admission: RE | Admit: 2020-06-14 | Discharge: 2020-06-14 | Disposition: A | Discharge status: Home / Self Care | Payer: Medicare Other | Source: Ambulatory Visit | Attending: Family Medicine | Admitting: Family Medicine

## 2020-06-14 DIAGNOSIS — N6489 Other specified disorders of breast: Secondary | ICD-10-CM | POA: Diagnosis not present

## 2020-06-14 DIAGNOSIS — R928 Other abnormal and inconclusive findings on diagnostic imaging of breast: Secondary | ICD-10-CM

## 2020-06-14 DIAGNOSIS — R921 Mammographic calcification found on diagnostic imaging of breast: Secondary | ICD-10-CM | POA: Diagnosis not present

## 2020-06-27 DIAGNOSIS — Z1283 Encounter for screening for malignant neoplasm of skin: Secondary | ICD-10-CM | POA: Diagnosis not present

## 2020-06-27 DIAGNOSIS — D1801 Hemangioma of skin and subcutaneous tissue: Secondary | ICD-10-CM | POA: Diagnosis not present

## 2020-06-27 DIAGNOSIS — L578 Other skin changes due to chronic exposure to nonionizing radiation: Secondary | ICD-10-CM | POA: Diagnosis not present

## 2020-06-27 DIAGNOSIS — D2260 Melanocytic nevi of unspecified upper limb, including shoulder: Secondary | ICD-10-CM | POA: Diagnosis not present

## 2020-06-27 DIAGNOSIS — D225 Melanocytic nevi of trunk: Secondary | ICD-10-CM | POA: Diagnosis not present

## 2020-06-27 DIAGNOSIS — L814 Other melanin hyperpigmentation: Secondary | ICD-10-CM | POA: Diagnosis not present

## 2020-06-27 DIAGNOSIS — L570 Actinic keratosis: Secondary | ICD-10-CM | POA: Diagnosis not present

## 2020-06-27 DIAGNOSIS — D2270 Melanocytic nevi of unspecified lower limb, including hip: Secondary | ICD-10-CM | POA: Diagnosis not present

## 2020-06-27 DIAGNOSIS — Z85828 Personal history of other malignant neoplasm of skin: Secondary | ICD-10-CM | POA: Diagnosis not present

## 2020-07-24 ENCOUNTER — Other Ambulatory Visit: Payer: Self-pay

## 2020-07-24 ENCOUNTER — Ambulatory Visit (AMBULATORY_SURGERY_CENTER): Payer: Self-pay

## 2020-07-24 VITALS — Ht 61.75 in | Wt 136.0 lb

## 2020-07-24 DIAGNOSIS — Z86010 Personal history of colonic polyps: Secondary | ICD-10-CM

## 2020-07-24 MED ORDER — NA SULFATE-K SULFATE-MG SULF 17.5-3.13-1.6 GM/177ML PO SOLN: 1.0000 | Freq: Once | ORAL | 0 refills | Status: AC

## 2020-07-24 NOTE — Progress Notes (Signed)
No egg or soy allergy known to patient  No issues with past sedation with any surgeries or procedures Patient denies ever being told they had issues or difficulty with intubation  No FH of Malignant Hyperthermia No diet pills per patient No home 02 use per patient  No blood thinners per patient  Pt denies issues with constipation  No A fib or A flutter  EMMI video to pt or via [place] 19 guidelines implemented in [place] today with Pt and RN  Pt is fully vaccinated  for Covid   No Coupon given to pt in PV today , no Code to Pharmacy and  NO PA's for preps discussed with pt In PV today  Discussed with pt there will be an out-of-pocket cost for prep and that varies from $0 to 70 dollars   Due to the COVID-19 pandemic we are asking patients to follow certain guidelines.  Pt aware of COVID protocols and [facility] guidelines

## 2020-08-07 ENCOUNTER — Ambulatory Visit (AMBULATORY_SURGERY_CENTER): Payer: Medicare Other | Admitting: Internal Medicine

## 2020-08-07 ENCOUNTER — Encounter: Payer: Self-pay | Admitting: Internal Medicine

## 2020-08-07 ENCOUNTER — Other Ambulatory Visit: Payer: Self-pay

## 2020-08-07 VITALS — BP 113/67 | HR 69 | Temp 96.9°F | Resp 12 | Ht 61.0 in | Wt 134.0 lb

## 2020-08-07 DIAGNOSIS — D122 Benign neoplasm of ascending colon: Secondary | ICD-10-CM

## 2020-08-07 DIAGNOSIS — Z86010 Personal history of colonic polyps: Secondary | ICD-10-CM | POA: Diagnosis not present

## 2020-08-07 MED ORDER — SODIUM CHLORIDE 0.9 % IV SOLN: 500.0000 mL | Freq: Once | INTRAVENOUS | Status: DC

## 2020-08-07 NOTE — Patient Instructions (Addendum)
Handout was given to your care partner on polyps. You may resume your current medications today. Await biopsy results.  May take 1-3 weeks to receive pathology results. Please call if any questions or concerns.    YOU HAD AN ENDOSCOPIC PROCEDURE TODAY AT THE [facility]:   Refer to the procedure report that was given to you for any specific questions about what was found during the examination.  If the procedure report does not answer your questions, please call your gastroenterologist to clarify.  If you requested that your care partner not be given the details of your procedure findings, then the procedure report has been included in a sealed envelope for you to review at your convenience later.  YOU SHOULD EXPECT: Some feelings of bloating in the abdomen. Passage of more gas than usual.  Walking can help get rid of the air that was put into your GI tract during the procedure and reduce the bloating. If you had a lower endoscopy (such as a colonoscopy or flexible sigmoidoscopy) you may notice spotting of blood in your stool or on the toilet paper. If you underwent a bowel prep for your procedure, you may not have a normal bowel movement for a few days.  Please Note:  You might notice some irritation and congestion in your nose or some drainage.  This is from the oxygen used during your procedure.  There is no need for concern and it should clear up in a day or so.  SYMPTOMS TO REPORT IMMEDIATELY:  Following lower endoscopy (colonoscopy or flexible sigmoidoscopy):  Excessive amounts of blood in the stool  Significant tenderness or worsening of abdominal pains  Swelling of the abdomen that is new, acute  Fever of 100F or higher    For urgent or emergent issues, a gastroenterologist can be reached at any hour by calling [phone]. Do not use MyChart messaging for urgent concerns.    DIET:  We do recommend a small meal at first, but then you may proceed to your regular  diet.  Drink plenty of fluids but you should avoid alcoholic beverages for 24 hours.  ACTIVITY:  You should plan to take it easy for the rest of today and you should NOT DRIVE or use heavy machinery until tomorrow (because of the sedation medicines used during the test).    FOLLOW UP: Our staff will call the number listed on your records 48-72 hours following your procedure to check on you and address any questions or concerns that you may have regarding the information given to you following your procedure. If we do not reach you, we will leave a message.  We will attempt to reach you two times.  During this call, we will ask if you have developed any symptoms of COVID 19. If you develop any symptoms (ie: fever, flu-like symptoms, shortness of breath, cough etc.) before then, please call [phone].  If you test positive for Covid 19 in the 2 weeks post procedure, please call and report this information to us.    If any biopsies were taken you will be contacted by phone or by letter within the next 1-3 weeks.  Please call us at [phone] if you have not heard about the biopsies in 3 weeks.    SIGNATURES/CONFIDENTIALITY: You and/or your care partner have signed paperwork which will be entered into your electronic medical record.  These signatures attest to the fact that that the information above on your After Visit Summary has been   reviewed and is understood.  Full responsibility of the confidentiality of this discharge information lies with you and/or your care-partner.    

## 2020-08-07 NOTE — Progress Notes (Signed)
Called to room to assist during endoscopic procedure.  Patient ID and intended procedure confirmed with present staff. Received instructions for my participation in the procedure from the performing physician.  

## 2020-08-07 NOTE — Progress Notes (Signed)
No problems noted in the recovery room. maw 

## 2020-08-07 NOTE — Op Note (Signed)
[facility] Patient Name: Tina Mendez Procedure Date: 08/07/2020 1:28 PM MRN: [id number] Endoscopist: Docia Chuck. Henrene Pastor , MD Age: 75 Referring MD:  Date of Birth: 02-Mar-1946 Gender: Female Account #: [id number] Procedure:                Colonoscopy with cold snare x 1 Indications:              High risk colon cancer surveillance: Personal                            history of multiple (3 or more) adenomas. prior                            2003, 2010, 2014 Medicines:                Monitored Anesthesia Care Procedure:                Pre-Anesthesia Assessment:                           - Prior to the procedure, a History and Physical                            was performed, and patient medications and                            allergies were reviewed. The patient's tolerance of                            previous anesthesia was also reviewed. The risks                            and benefits of the procedure and the sedation                            options and risks were discussed with the patient.                            All questions were answered, and informed consent                            was obtained. Prior Anticoagulants: The patient has                            taken no previous anticoagulant or antiplatelet                            agents. ASA Grade Assessment: II - A patient with                            mild systemic disease. After reviewing the risks                            and benefits, the patient was deemed in  satisfactory condition to undergo the procedure.                           After obtaining informed consent, the colonoscope                            was passed under direct vision. Throughout the                            procedure, the patient's blood pressure, pulse, and                            oxygen saturations were monitored continuously. The                            Olympus CF-HQ190L [id number])  Colonoscope was                            introduced through the anus and advanced to the the                            cecum, identified by appendiceal orifice and                            ileocecal valve. The ileocecal valve, appendiceal                            orifice, and rectum were photographed. The quality                            of the bowel preparation was excellent. The                            colonoscopy was performed without difficulty. The                            patient tolerated the procedure well. The bowel                            preparation used was SUPREP via split dose                            instruction. Scope In: 1:55:48 PM Scope Out: 2:11:31 PM Scope Withdrawal Time: 0 hours 11 minutes 5 seconds  Total Procedure Duration: 0 hours 15 minutes 43 seconds  Findings:                 A 3 mm polyp was found in the ascending colon. The                            polyp was sessile. The polyp was removed with a                            cold snare. Resection and retrieval were complete.  The exam was otherwise without abnormality on                            direct and retroflexion views. Complications:            No immediate complications. Estimated blood loss:                            None. Estimated Blood Loss:     Estimated blood loss: none. Impression:               - One 3 mm polyp in the ascending colon, removed                            with a cold snare. Resected and retrieved.                           - The examination was otherwise normal on direct                            and retroflexion views. Recommendation:           - Repeat colonoscopy in 5 years for surveillance.                           - Patient has a contact number available for                            emergencies. The signs and symptoms of potential                            delayed complications were discussed with the                             patient. Return to normal activities tomorrow.                            Written discharge instructions were provided to the                            patient.                           - Resume previous diet.                           - Continue present medications.                           - Await pathology results. Docia Chuck. Henrene Pastor, MD 08/07/2020 2:30:09 PM This report has been signed electronically.

## 2020-08-07 NOTE — Progress Notes (Signed)
Report to PACU, RN, vss, BBS= Clear.  

## 2020-08-07 NOTE — Progress Notes (Signed)
VS by MO   I have reviewed the patient's medical history in detail and updated the computerized patient record.  

## 2020-08-09 ENCOUNTER — Telehealth: Payer: Self-pay

## 2020-08-09 NOTE — Telephone Encounter (Signed)
  Follow up Call-  Call back number 08/07/2020  Post procedure Call Back phone  # [phone]  Permission to leave phone message Yes  Some recent data might be hidden     Patient questions:  Do you have a fever, pain , or abdominal swelling? No. Pain Score  0 *  Have you tolerated food without any problems? Yes.    Have you been able to return to your normal activities? Yes.    Do you have any questions about your discharge instructions: Diet   No. Medications  No. Follow up visit  No.  Do you have questions or concerns about your Care? No.  Actions: * If pain score is 4 or above: No action needed, pain <4.  1. Have you developed a fever since your procedure? no  2.   Have you had an respiratory symptoms (SOB or cough) since your procedure? no  3.   Have you tested positive for COVID 19 since your procedure no  4.   Have you had any family members/close contacts diagnosed with the COVID 19 since your procedure?  no   If yes to any of these questions please route to Joylene John, RN and Joella Prince, RN

## 2020-08-16 ENCOUNTER — Encounter: Payer: Self-pay | Admitting: Internal Medicine

## 2020-08-21 DIAGNOSIS — D0439 Carcinoma in situ of skin of other parts of face: Secondary | ICD-10-CM | POA: Diagnosis not present

## 2020-12-11 ENCOUNTER — Telehealth: Payer: Self-pay | Admitting: Family Medicine

## 2020-12-11 DIAGNOSIS — E559 Vitamin D deficiency, unspecified: Secondary | ICD-10-CM

## 2020-12-11 DIAGNOSIS — E875 Hyperkalemia: Secondary | ICD-10-CM

## 2020-12-11 DIAGNOSIS — E538 Deficiency of other specified B group vitamins: Secondary | ICD-10-CM

## 2020-12-11 DIAGNOSIS — E7800 Pure hypercholesterolemia, unspecified: Secondary | ICD-10-CM

## 2020-12-11 NOTE — Telephone Encounter (Signed)
-----   Message from Ellamae Sia sent at 11/26/2020 11:03 AM EDT ----- Regarding: Lab orders for Wednesday, 9.8.22 Patient is scheduled for CPX labs, please order future labs, Thanks , Karna Christmas

## 2020-12-12 ENCOUNTER — Other Ambulatory Visit (INDEPENDENT_AMBULATORY_CARE_PROVIDER_SITE_OTHER): Payer: Medicare Other

## 2020-12-12 ENCOUNTER — Ambulatory Visit: Payer: Medicare Other

## 2020-12-12 ENCOUNTER — Other Ambulatory Visit: Payer: Self-pay

## 2020-12-12 DIAGNOSIS — E875 Hyperkalemia: Secondary | ICD-10-CM | POA: Diagnosis not present

## 2020-12-12 DIAGNOSIS — E559 Vitamin D deficiency, unspecified: Secondary | ICD-10-CM | POA: Diagnosis not present

## 2020-12-12 DIAGNOSIS — E7800 Pure hypercholesterolemia, unspecified: Secondary | ICD-10-CM

## 2020-12-12 DIAGNOSIS — E538 Deficiency of other specified B group vitamins: Secondary | ICD-10-CM | POA: Diagnosis not present

## 2020-12-12 LAB — COMPREHENSIVE METABOLIC PANEL
ALT: 11 U/L (ref 0–35)
AST: 17 U/L (ref 0–37)
Albumin: 4.2 g/dL (ref 3.5–5.2)
Alkaline Phosphatase: 61 U/L (ref 39–117)
BUN: 16 mg/dL (ref 6–23)
CO2: 27 mEq/L (ref 19–32)
Calcium: 9.6 mg/dL (ref 8.4–10.5)
Chloride: 105 mEq/L (ref 96–112)
Creatinine, Ser: 0.68 mg/dL (ref 0.40–1.20)
GFR: 85.91 mL/min (ref >60.00)
Glucose, Bld: 92 mg/dL (ref 70–99)
Potassium: 4.3 mEq/L (ref 3.5–5.1)
Sodium: 140 mEq/L (ref 135–145)
Total Bilirubin: 1.1 mg/dL (ref 0.2–1.2)
Total Protein: 6.5 g/dL (ref 6.0–8.3)

## 2020-12-12 LAB — CBC WITH DIFFERENTIAL/PLATELET
Basophils Absolute: 0 10*3/uL (ref 0.0–0.1)
Basophils Relative: 0.9 % (ref 0.0–3.0)
Eosinophils Absolute: 0.1 10*3/uL (ref 0.0–0.7)
Eosinophils Relative: 1.9 % (ref 0.0–5.0)
HCT: 39.7 % (ref 36.0–46.0)
Hemoglobin: 13.4 g/dL (ref 12.0–15.0)
Lymphocytes Relative: 34.5 % (ref 12.0–46.0)
Lymphs Abs: 1.2 10*3/uL (ref 0.7–4.0)
MCHC: 33.7 g/dL (ref 30.0–36.0)
MCV: 95 fl (ref 78.0–100.0)
Monocytes Absolute: 0.3 10*3/uL (ref 0.1–1.0)
Monocytes Relative: 7.1 % (ref 3.0–12.0)
Neutro Abs: 2 10*3/uL (ref 1.4–7.7)
Neutrophils Relative %: 55.6 % (ref 43.0–77.0)
Platelets: 159 10*3/uL (ref 150.0–400.0)
RBC: 4.18 Mil/uL (ref 3.87–5.11)
RDW: 12.3 % (ref 11.5–15.5)
WBC: 3.6 10*3/uL — ABNORMAL LOW (ref 4.0–10.5)

## 2020-12-12 LAB — LIPID PANEL
Cholesterol: 224 mg/dL — ABNORMAL HIGH (ref 0–200)
HDL: 68.4 mg/dL (ref >39.00)
LDL Cholesterol: 140 mg/dL — ABNORMAL HIGH (ref 0–99)
NonHDL: 155.6
Total CHOL/HDL Ratio: 3
Triglycerides: 76 mg/dL (ref 0.0–149.0)
VLDL: 15.2 mg/dL (ref 0.0–40.0)

## 2020-12-12 LAB — VITAMIN D 25 HYDROXY (VIT D DEFICIENCY, FRACTURES): VITD: 48.33 ng/mL (ref 30.00–100.00)

## 2020-12-12 LAB — TSH: TSH: 1.87 u[IU]/mL (ref 0.35–5.50)

## 2020-12-12 LAB — VITAMIN B12: Vitamin B-12: 878 pg/mL (ref 211–911)

## 2020-12-19 ENCOUNTER — Encounter: Payer: Medicare Other | Admitting: Family Medicine

## 2021-01-02 ENCOUNTER — Encounter: Payer: Self-pay | Admitting: Family Medicine

## 2021-01-02 ENCOUNTER — Other Ambulatory Visit: Payer: Self-pay

## 2021-01-02 ENCOUNTER — Ambulatory Visit (INDEPENDENT_AMBULATORY_CARE_PROVIDER_SITE_OTHER): Payer: Medicare Other | Admitting: Family Medicine

## 2021-01-02 VITALS — BP 116/72 | HR 62 | Temp 97.4°F | Ht 61.0 in | Wt 133.0 lb

## 2021-01-02 DIAGNOSIS — E538 Deficiency of other specified B group vitamins: Secondary | ICD-10-CM

## 2021-01-02 DIAGNOSIS — M810 Age-related osteoporosis without current pathological fracture: Secondary | ICD-10-CM | POA: Diagnosis not present

## 2021-01-02 DIAGNOSIS — E7800 Pure hypercholesterolemia, unspecified: Secondary | ICD-10-CM | POA: Diagnosis not present

## 2021-01-02 DIAGNOSIS — E559 Vitamin D deficiency, unspecified: Secondary | ICD-10-CM

## 2021-01-02 DIAGNOSIS — Z0000 Encounter for general adult medical examination without abnormal findings: Secondary | ICD-10-CM

## 2021-01-02 DIAGNOSIS — E2839 Other primary ovarian failure: Secondary | ICD-10-CM

## 2021-01-02 NOTE — Assessment & Plan Note (Signed)
Reviewed health habits including diet and exercise and skin cancer prevention Reviewed appropriate screening tests for age  Also reviewed health mt list, fam hx and immunization status , as well as social and family history    Colonoscopy up to date with 5 y recall Mammogram utd/reviewed breast bx  Declines flu vaccine for now  Had side effects from covid imm  dexa ordered No falls or fractures , taking ca and D  Advance directive utd  No cognitive concerns  PHQ score of 0 No hearing or vision problems /utd eye care  No problems with ADLs

## 2021-01-02 NOTE — Assessment & Plan Note (Signed)
Level of 48 on current supplementation   Vitamin D level is therapeutic with current supplementation Disc importance of this to bone and overall health

## 2021-01-02 NOTE — Assessment & Plan Note (Signed)
Lab Results  Component Value Date   VITAMINB12 878 12/12/2020  high normal  Taking 1000 mcg daily  Will reduce to 500 mcg daily

## 2021-01-02 NOTE — Assessment & Plan Note (Signed)
Taking ca and D Due for dexa -order done  No falls or fx Some recent dental problems/ would hold off on bisphosphonate tx for now   D level is 48

## 2021-01-02 NOTE — Progress Notes (Signed)
Subjective:    Patient ID: Tina Mendez, female    DOB: [birth year], 75 y.o.   MRN: [id number]  This visit occurred during the SARS-CoV-2 public health emergency.  Safety protocols were in place, including screening questions prior to the visit, additional usage of staff PPE, and extensive cleaning of exam room while observing appropriate contact time as indicated for disinfecting solutions.   HPI Pt presents for amw and review of chronic health problems  I have personally reviewed the Medicare Annual Wellness questionnaire and have noted 1. The patient's medical and social history 2. Their use of alcohol, tobacco or illicit drugs 3. Their current medications and supplements 4. The patient's functional ability including ADL's, fall risks, home safety risks and hearing or visual             impairment. 5. Diet and physical activities 6. Evidence for depression or mood disorders  The patients weight, height, BMI have been recorded in the chart and visual acuity is per eye clinic.  I have made referrals, counseling and provided education to the patient based review of the above and I have provided the pt with a written personalized care plan for preventive services. Reviewed and updated provider list, see scanned forms.  See scanned forms.  Routine anticipatory guidance given to patient.  See health maintenance. Taking care of herself Feels ok  Colon cancer screening  colonoscopy 5/22- adenoma /5 y recall  Breast cancer screening  mammogram 3/22   bx of R breast -b9 That was worrisome  History of breast cancer in herself  Self breast exam-no lumps  Flu vaccine- not ready to get one yet  Covid immunized -one vaccine gave her vertigo /did not have another  Had 2nd case of covid in January  Tetanus vaccine Td 2014 Pneumovax  up to date Zoster vaccine- interested  Dexa 9/20  OP in TS  Falls none Fractures-none Supplements ca and D D level is 48 Exercise - walks just about every  day  Would like to try stretching   Advance directive-up to date  Cognitive function addressed- see scanned forms- and if abnormal then additional documentation follows.   No problems at all Does not misplace things  Likes to read (esp magazines and paper)  Also socializes Is active in the church  Does her own checkbook    PMH and SH reviewed  Meds, vitals, and allergies reviewed.   ROS: See HPI.  Otherwise negative.    Weight : Wt Readings from Last 3 Encounters:  01/02/21 133 lb (60.3 kg)  08/07/20 134 lb (60.8 kg)  07/24/20 136 lb (61.7 kg)   25.13 kg/m   BP Readings from Last 3 Encounters:  01/02/21 116/72  08/07/20 113/67  12/07/19 124/68   Pulse Readings from Last 3 Encounters:  01/02/21 62  08/07/20 69  12/07/19 79     Hearing/vision: No results found.  No problems at all with hearing or vision   Had her eye/vision exam recently with [facility] vision center   Dental problems (regular dental care)  Implant and had root canal  Now doing better  Peridontist does deep cleaning    PHQ: Depression screen [facility] 2/9 01/02/2021 12/06/2019 12/02/2018 07/03/2017 06/06/2016  Decreased Interest 0 0 0 0 0  Down, Depressed, Hopeless 0 0 0 0 0  PHQ - 2 Score 0 0 0 0 0  Altered sleeping - 0 - 0 -  Tired, decreased energy - 0 - 0 -  Change in appetite -  0 - 0 -  Feeling bad or failure about yourself  - 0 - 0 -  Trouble concentrating - 0 - 0 -  Moving slowly or fidgety/restless - 0 - 0 -  Suicidal thoughts - 0 - 0 -  PHQ-9 Score - 0 - 0 -  Difficult doing work/chores - Not difficult at all - Not difficult at all -   Mood has been good   ADLs: no problems  Functionality: no problems   Care team : Esteven Overfelt-pcp Streck-gen surg Magrinat-onc Henrene Pastor- GI   Hyperlipidemia Lab Results  Component Value Date   CHOL 224 (H) 12/12/2020   CHOL 213 (H) 12/05/2019   CHOL 223 (H) 11/25/2018   Lab Results  Component Value Date   HDL 68.40 12/12/2020   HDL 66.50  12/05/2019   HDL 73.10 11/25/2018   Lab Results  Component Value Date   LDLCALC 140 (H) 12/12/2020   LDLCALC 134 (H) 12/05/2019   LDLCALC 135 (H) 11/25/2018   Lab Results  Component Value Date   TRIG 76.0 12/12/2020   TRIG 62.0 12/05/2019   TRIG 75.0 11/25/2018   Lab Results  Component Value Date   CHOLHDL 3 12/12/2020   CHOLHDL 3 12/05/2019   CHOLHDL 3 11/25/2018   Lab Results  Component Value Date   LDLDIRECT 145.7 05/20/2013   LDLDIRECT 153.9 11/11/2012   LDLDIRECT 152.9 09/29/2008   Declines medication for this More bacon/sausage and burgers  Is doing better now   B12 def Lab Results  Component Value Date   VITAMINB12 878 12/12/2020   Initially had 3 shots and takes 1000 mcg    Other labs Lab Results  Component Value Date   WBC 3.6 (L) 12/12/2020   HGB 13.4 12/12/2020   HCT 39.7 12/12/2020   MCV 95.0 12/12/2020   PLT 159.0 12/12/2020   Lab Results  Component Value Date   CREATININE 0.68 12/12/2020   BUN 16 12/12/2020   NA 140 12/12/2020   K 4.3 12/12/2020   CL 105 12/12/2020   CO2 27 12/12/2020   Lab Results  Component Value Date   ALT 11 12/12/2020   AST 17 12/12/2020   ALKPHOS 61 12/12/2020   BILITOT 1.1 12/12/2020   Lab Results  Component Value Date   TSH 1.87 12/12/2020    Sleep is not as good  One cup of coffee in am Avoids more caffeine    Patient Active Problem List   Diagnosis Date Noted   Neuropathy 12/07/2019   B12 deficiency 12/07/2019   Hyperkalemia 12/02/2019   Allergic reaction 10/17/2016   Estrogen deficiency 06/06/2016   Routine general medical examination at a health care facility 05/24/2015   Vitamin D deficiency 05/29/2014   Encounter for routine gynecological examination 11/17/2012   Encounter for Medicare annual wellness exam 11/10/2012   PURE HYPERCHOLESTEROLEMIA 06/23/2008   Osteoporosis 06/16/2008   hx: breast cancer, left IDC, 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 + 06/16/2008   Past Medical History:  Diagnosis Date    Abdominal pain 05/2018   right lower abd pain/one time   Allergy    pollen   Basal cell carcinoma of skin    mainly on face   Cancer ([place]) 2002   L BREAST/had mastectomy   Colon polyp    Hemorrhoids    hx of    History of anal fissures    Osteoporosis    Plantar fasciitis    Past Surgical History:  Procedure Laterality Date   Basal Cell Lesion  2018   removed from nose/ at [place]   Breast CA signs - Chemo  06/2000   no radiation   BREAST CYST EXCISION  1970   rt   [place]   1 time   COLONOSCOPY  2014   MASTECTOMY  2002   LEFT   Social History   Tobacco Use   Smoking status: Never   Smokeless tobacco: Never  Vaping Use   Vaping Use: Never used  Substance Use Topics   Alcohol use: No    Alcohol/week: 0.0 standard drinks   Drug use: No   Family History  Problem Relation Age of Onset   Diabetes Mother    Heart disease Mother    Heart failure Mother    Colon polyps Father    Cancer Paternal Aunt        breast   Cancer Paternal Uncle        Lung CA, smoker   Colon cancer Neg Hx    Breast cancer Neg Hx    Esophageal cancer Neg Hx    Rectal cancer Neg Hx    Stomach cancer Neg Hx    Allergies  Allergen Reactions   Sulfonamide Derivatives Nausea Only   Current Outpatient Medications on File Prior to Visit  Medication Sig Dispense Refill   Calcium Carbonate-Vitamin D3 600-400 MG-UNIT TABS Take one pill daily     Cholecalciferol (VITAMIN D3) 1000 units CAPS daily. Take one pill daily     cyanocobalamin 1000 MCG tablet Take by mouth.     No current facility-administered medications on file prior to visit.     Review of Systems  Constitutional:  Negative for activity change, appetite change, fatigue, fever and unexpected weight change.  HENT:  Negative for congestion, ear pain, rhinorrhea, sinus pressure and sore throat.        Dental problems  Eyes:  Negative for pain, redness and visual disturbance.  Respiratory:  Negative for  cough, shortness of breath and wheezing.   Cardiovascular:  Negative for chest pain and palpitations.  Gastrointestinal:  Negative for abdominal pain, blood in stool, constipation and diarrhea.  Endocrine: Negative for polydipsia and polyuria.  Genitourinary:  Negative for dysuria, frequency and urgency.  Musculoskeletal:  Negative for arthralgias, back pain and myalgias.  Skin:  Negative for pallor and rash.  Allergic/Immunologic: Negative for environmental allergies.  Neurological:  Negative for dizziness, syncope and headaches.       Had vertigo after her covid vaccine     Some tingling in feet, ? neuropathy  Hematological:  Negative for adenopathy. Does not bruise/bleed easily.  Psychiatric/Behavioral:  Negative for decreased concentration and dysphoric mood. The patient is not nervous/anxious.       Objective:   Physical Exam Constitutional:      General: She is not in acute distress.    Appearance: Normal appearance. She is well-developed and normal weight. She is not ill-appearing or diaphoretic.  HENT:     Head: Normocephalic and atraumatic.     Right Ear: Tympanic membrane, ear canal and external ear normal.     Left Ear: Tympanic membrane, ear canal and external ear normal.     Nose: Nose normal. No congestion.     Mouth/Throat:     Mouth: Mucous membranes are moist.     Pharynx: Oropharynx is clear. No posterior oropharyngeal erythema.  Eyes:     General: No scleral icterus.    Extraocular Movements: Extraocular movements intact.  Conjunctiva/sclera: Conjunctivae normal.     Pupils: Pupils are equal, round, and reactive to light.  Neck:     Thyroid: No thyromegaly.     Vascular: No carotid bruit or JVD.  Cardiovascular:     Rate and Rhythm: Normal rate and regular rhythm.     Pulses: Normal pulses.     Heart sounds: Normal heart sounds.    No gallop.  Pulmonary:     Effort: Pulmonary effort is normal. No respiratory distress.     Breath sounds: Normal  breath sounds. No wheezing.     Comments: Good air exch Chest:     Chest wall: No tenderness.  Abdominal:     General: Bowel sounds are normal. There is no distension or abdominal bruit.     Palpations: Abdomen is soft. There is no mass.     Tenderness: There is no abdominal tenderness.     Hernia: No hernia is present.  Genitourinary:    Comments: Breast exam: No mass, nodules, thickening, tenderness, bulging, retraction, inflamation, nipple discharge or skin changes noted.  No axillary or clavicular LA.     Musculoskeletal:        General: No tenderness. Normal range of motion.     Cervical back: Normal range of motion and neck supple. No rigidity. No muscular tenderness.     Right lower leg: No edema.     Left lower leg: No edema.     Comments: No kyphosis   Lymphadenopathy:     Cervical: No cervical adenopathy.  Skin:    General: Skin is warm and dry.     Coloration: Skin is not pale.     Findings: No erythema or rash.  Neurological:     Mental Status: She is alert. Mental status is at baseline.     Cranial Nerves: No cranial nerve deficit.     Motor: No abnormal muscle tone.     Coordination: Coordination normal.     Gait: Gait normal.     Deep Tendon Reflexes: Reflexes are normal and symmetric. Reflexes normal.  Psychiatric:        Mood and Affect: Mood normal.        Cognition and Memory: Cognition and memory normal.          Assessment & Plan:   Problem List Items Addressed This Visit       Musculoskeletal and Integument   Osteoporosis    Taking ca and D Due for dexa -order done  No falls or fx Some recent dental problems/ would hold off on bisphosphonate tx for now   D level is 48         Other   PURE HYPERCHOLESTEROLEMIA    Disc goals for lipids and reasons to control them Rev last labs with pt Rev low sat fat diet in detail LDL is up slightly  Not eating well  Plans to stop the fatty pork and beef  Declines medication       Encounter for  Medicare annual wellness exam - Primary    Reviewed health habits including diet and exercise and skin cancer prevention Reviewed appropriate screening tests for age  Also reviewed health mt list, fam hx and immunization status , as well as social and family history    Colonoscopy up to date with 5 y recall Mammogram utd/reviewed breast bx  Declines flu vaccine for now  Had side effects from covid imm  dexa ordered No falls or fractures , taking ca and  D  Advance directive utd  No cognitive concerns  PHQ score of 0 No hearing or vision problems /utd eye care  No problems with ADLs        Vitamin D deficiency    Level of 48 on current supplementation   Vitamin D level is therapeutic with current supplementation Disc importance of this to bone and overall health       Estrogen deficiency   B12 deficiency    Lab Results  Component Value Date   VITAMINB12 878 12/12/2020  high normal  Taking 1000 mcg daily  Will reduce to 500 mcg daily

## 2021-01-02 NOTE — Patient Instructions (Addendum)
If you are interested in the new shingles vaccine (Shingrix) - call your local pharmacy to check on coverage and availability  If affordable, get on a wait list at your pharmacy to get the vaccine.  Get a flu shot when you are ready  Chair yoga is very popular- you can videos  It is fun  Consider adding that to the walking    For cholesterol Avoid red meat/ fried foods/ egg yolks/ fatty breakfast meats/ butter, cheese and high fat dairy/ and shellfish    Take vitamin B12 500 mcg daily (cut from 1000 )

## 2021-01-02 NOTE — Assessment & Plan Note (Signed)
Disc goals for lipids and reasons to control them Rev last labs with pt Rev low sat fat diet in detail LDL is up slightly  Not eating well  Plans to stop the fatty pork and beef  Declines medication

## 2021-01-03 DIAGNOSIS — D2270 Melanocytic nevi of unspecified lower limb, including hip: Secondary | ICD-10-CM | POA: Diagnosis not present

## 2021-01-03 DIAGNOSIS — Z1283 Encounter for screening for malignant neoplasm of skin: Secondary | ICD-10-CM | POA: Diagnosis not present

## 2021-01-03 DIAGNOSIS — Z85828 Personal history of other malignant neoplasm of skin: Secondary | ICD-10-CM | POA: Diagnosis not present

## 2021-01-03 DIAGNOSIS — L570 Actinic keratosis: Secondary | ICD-10-CM | POA: Diagnosis not present

## 2021-01-03 DIAGNOSIS — D225 Melanocytic nevi of trunk: Secondary | ICD-10-CM | POA: Diagnosis not present

## 2021-01-03 DIAGNOSIS — D2260 Melanocytic nevi of unspecified upper limb, including shoulder: Secondary | ICD-10-CM | POA: Diagnosis not present

## 2021-01-03 DIAGNOSIS — D1801 Hemangioma of skin and subcutaneous tissue: Secondary | ICD-10-CM | POA: Diagnosis not present

## 2021-01-03 DIAGNOSIS — D485 Neoplasm of uncertain behavior of skin: Secondary | ICD-10-CM | POA: Diagnosis not present

## 2021-01-03 DIAGNOSIS — L578 Other skin changes due to chronic exposure to nonionizing radiation: Secondary | ICD-10-CM | POA: Diagnosis not present

## 2021-01-03 DIAGNOSIS — L814 Other melanin hyperpigmentation: Secondary | ICD-10-CM | POA: Diagnosis not present

## 2021-01-03 DIAGNOSIS — L821 Other seborrheic keratosis: Secondary | ICD-10-CM | POA: Diagnosis not present

## 2021-02-11 DIAGNOSIS — C44311 Basal cell carcinoma of skin of nose: Secondary | ICD-10-CM | POA: Diagnosis not present

## 2021-02-11 DIAGNOSIS — Z79899 Other long term (current) drug therapy: Secondary | ICD-10-CM | POA: Diagnosis not present

## 2021-02-11 DIAGNOSIS — D485 Neoplasm of uncertain behavior of skin: Secondary | ICD-10-CM | POA: Diagnosis not present

## 2021-02-11 DIAGNOSIS — C44719 Basal cell carcinoma of skin of left lower limb, including hip: Secondary | ICD-10-CM | POA: Diagnosis not present

## 2021-05-13 ENCOUNTER — Encounter: Payer: Self-pay | Admitting: Family Medicine

## 2021-05-13 DIAGNOSIS — C44311 Basal cell carcinoma of skin of nose: Secondary | ICD-10-CM | POA: Diagnosis not present

## 2021-06-07 ENCOUNTER — Other Ambulatory Visit: Payer: Self-pay | Admitting: Family Medicine

## 2021-06-07 DIAGNOSIS — Z1231 Encounter for screening mammogram for malignant neoplasm of breast: Secondary | ICD-10-CM

## 2021-06-18 ENCOUNTER — Ambulatory Visit: Payer: Medicare Other

## 2021-07-03 ENCOUNTER — Ambulatory Visit: Payer: Medicare Other

## 2021-07-09 ENCOUNTER — Ambulatory Visit
Admission: RE | Admit: 2021-07-09 | Discharge: 2021-07-09 | Disposition: A | Discharge status: Home / Self Care | Payer: Medicare Other | Source: Ambulatory Visit | Attending: Family Medicine | Admitting: Family Medicine

## 2021-07-09 DIAGNOSIS — Z1231 Encounter for screening mammogram for malignant neoplasm of breast: Secondary | ICD-10-CM

## 2021-08-06 DIAGNOSIS — C44719 Basal cell carcinoma of skin of left lower limb, including hip: Secondary | ICD-10-CM | POA: Diagnosis not present

## 2021-08-06 DIAGNOSIS — L570 Actinic keratosis: Secondary | ICD-10-CM | POA: Diagnosis not present

## 2021-08-06 DIAGNOSIS — Z85828 Personal history of other malignant neoplasm of skin: Secondary | ICD-10-CM | POA: Diagnosis not present

## 2021-08-06 DIAGNOSIS — C44712 Basal cell carcinoma of skin of right lower limb, including hip: Secondary | ICD-10-CM | POA: Diagnosis not present

## 2021-08-06 DIAGNOSIS — D1801 Hemangioma of skin and subcutaneous tissue: Secondary | ICD-10-CM | POA: Diagnosis not present

## 2021-08-06 DIAGNOSIS — Z1283 Encounter for screening for malignant neoplasm of skin: Secondary | ICD-10-CM | POA: Diagnosis not present

## 2021-08-06 DIAGNOSIS — D2260 Melanocytic nevi of unspecified upper limb, including shoulder: Secondary | ICD-10-CM | POA: Diagnosis not present

## 2021-08-06 DIAGNOSIS — D485 Neoplasm of uncertain behavior of skin: Secondary | ICD-10-CM | POA: Diagnosis not present

## 2021-08-06 DIAGNOSIS — L821 Other seborrheic keratosis: Secondary | ICD-10-CM | POA: Diagnosis not present

## 2021-08-06 DIAGNOSIS — D225 Melanocytic nevi of trunk: Secondary | ICD-10-CM | POA: Diagnosis not present

## 2021-08-06 DIAGNOSIS — L814 Other melanin hyperpigmentation: Secondary | ICD-10-CM | POA: Diagnosis not present

## 2021-08-06 DIAGNOSIS — L578 Other skin changes due to chronic exposure to nonionizing radiation: Secondary | ICD-10-CM | POA: Diagnosis not present

## 2021-09-25 DIAGNOSIS — C44311 Basal cell carcinoma of skin of nose: Secondary | ICD-10-CM | POA: Diagnosis not present

## 2021-12-12 ENCOUNTER — Ambulatory Visit: Payer: Medicare Other

## 2022-01-23 ENCOUNTER — Ambulatory Visit (INDEPENDENT_AMBULATORY_CARE_PROVIDER_SITE_OTHER): Payer: Medicare Other | Admitting: *Deleted

## 2022-01-23 ENCOUNTER — Telehealth: Payer: Self-pay | Admitting: Family Medicine

## 2022-01-23 DIAGNOSIS — E538 Deficiency of other specified B group vitamins: Secondary | ICD-10-CM

## 2022-01-23 DIAGNOSIS — Z0000 Encounter for general adult medical examination without abnormal findings: Secondary | ICD-10-CM

## 2022-01-23 DIAGNOSIS — M810 Age-related osteoporosis without current pathological fracture: Secondary | ICD-10-CM

## 2022-01-23 DIAGNOSIS — E7800 Pure hypercholesterolemia, unspecified: Secondary | ICD-10-CM

## 2022-01-23 DIAGNOSIS — E559 Vitamin D deficiency, unspecified: Secondary | ICD-10-CM

## 2022-01-23 DIAGNOSIS — E875 Hyperkalemia: Secondary | ICD-10-CM

## 2022-01-23 NOTE — Patient Instructions (Signed)
Tina Mendez , Thank you for taking time to come for your Medicare Wellness Visit. I appreciate your ongoing commitment to your health goals. Please review the following plan we discussed and let me know if I can assist you in the future.   These are the goals we discussed:  Goals      Increase physical activity     Starting 06/06/16, I will continue to walk on treadmill 30 min 4-5 days per week.      Increase physical activity     Starting 07/03/2017, I will continue to exercise for 30 minutes 5-7 days per week.      Increase physical activity     Balance exercises      Patient Stated     12/06/2019, I will continue to walk 5 days a week for 30 minutes.         This is a list of the screening recommended for you and due dates:  Health Maintenance  Topic Date Due   COVID-19 Vaccine (2 - Pfizer risk series) 02/08/2022*   Zoster (Shingles) Vaccine (1 of 2) 04/25/2022*   Flu Shot  07/06/2022*   Mammogram  07/10/2022   Tetanus Vaccine  11/18/2022   Colon Cancer Screening  08/07/2025   Pneumonia Vaccine  Completed   DEXA scan (bone density measurement)  Completed   Hepatitis C Screening: USPSTF Recommendation to screen - Ages 10-79 yo.  Completed   HPV Vaccine  Aged Out   Pap Smear  Discontinued  *Topic was postponed. The date shown is not the original due date.    Advanced directives: on file  Conditions/risks identified:   Next appointment: Follow up in one year for your annual wellness visit LABS  01-24-2022 @ 9:00     01-29-2022 @ 9:30  [facility]   Preventive Care 65 Years and Older, Female Preventive care refers to lifestyle choices and visits with your health care provider that can promote health and wellness. What does preventive care include? A yearly physical exam. This is also called an annual well check. Dental exams once or twice a year. Routine eye exams. Ask your health care provider how often you should have your eyes checked. Personal lifestyle choices,  including: Daily care of your teeth and gums. Regular physical activity. Eating a healthy diet. Avoiding tobacco and drug use. Limiting alcohol use. Practicing safe sex. Taking low-dose aspirin every day. Taking vitamin and mineral supplements as recommended by your health care provider. What happens during an annual well check? The services and screenings done by your health care provider during your annual well check will depend on your age, overall health, lifestyle risk factors, and family history of disease. Counseling  Your health care provider may ask you questions about your: Alcohol use. Tobacco use. Drug use. Emotional well-being. Home and relationship well-being. Sexual activity. Eating habits. History of falls. Memory and ability to understand (cognition). Work and work Statistician. Reproductive health. Screening  You may have the following tests or measurements: Height, weight, and BMI. Blood pressure. Lipid and cholesterol levels. These may be checked every 5 years, or more frequently if you are over 40 years old. Skin check. Lung cancer screening. You may have this screening every year starting at age 56 if you have a 30-pack-year history of smoking and currently smoke or have quit within the past 15 years. Fecal occult blood test (FOBT) of the stool. You may have this test every year starting at age 35. Flexible sigmoidoscopy or colonoscopy. You  may have a sigmoidoscopy every 5 years or a colonoscopy every 10 years starting at age 82. Hepatitis C blood test. Hepatitis B blood test. Sexually transmitted disease (STD) testing. Diabetes screening. This is done by checking your blood sugar (glucose) after you have not eaten for a while (fasting). You may have this done every 1-3 years. Bone density scan. This is done to screen for osteoporosis. You may have this done starting at age 73. Mammogram. This may be done every 1-2 years. Talk to your health care provider  about how often you should have regular mammograms. Talk with your health care provider about your test results, treatment options, and if necessary, the need for more tests. Vaccines  Your health care provider may recommend certain vaccines, such as: Influenza vaccine. This is recommended every year. Tetanus, diphtheria, and acellular pertussis (Tdap, Td) vaccine. You may need a Td booster every 10 years. Zoster vaccine. You may need this after age 84. Pneumococcal 13-valent conjugate (PCV13) vaccine. One dose is recommended after age 43. Pneumococcal polysaccharide (PPSV23) vaccine. One dose is recommended after age 40. Talk to your health care provider about which screenings and vaccines you need and how often you need them. This information is not intended to replace advice given to you by your health care provider. Make sure you discuss any questions you have with your health care provider. Document Released: 04/20/2015 Document Revised: 12/12/2015 Document Reviewed: 01/23/2015 Elsevier Interactive Patient Education  2017 [employer] Prevention in the Home Falls can cause injuries. They can happen to people of all ages. There are many things you can do to make your home safe and to help prevent falls. What can I do on the outside of my home? Regularly fix the edges of walkways and driveways and fix any cracks. Remove anything that might make you trip as you walk through a door, such as a raised step or threshold. Trim any bushes or trees on the path to your home. Use bright outdoor lighting. Clear any walking paths of anything that might make someone trip, such as rocks or tools. Regularly check to see if handrails are loose or broken. Make sure that both sides of any steps have handrails. Any raised decks and porches should have guardrails on the edges. Have any leaves, snow, or ice cleared regularly. Use sand or salt on walking paths during winter. Clean up any spills in  your garage right away. This includes oil or grease spills. What can I do in the bathroom? Use night lights. Install grab bars by the toilet and in the tub and shower. Do not use towel bars as grab bars. Use non-skid mats or decals in the tub or shower. If you need to sit down in the shower, use a plastic, non-slip stool. Keep the floor dry. Clean up any water that spills on the floor as soon as it happens. Remove soap buildup in the tub or shower regularly. Attach bath mats securely with double-sided non-slip rug tape. Do not have throw rugs and other things on the floor that can make you trip. What can I do in the bedroom? Use night lights. Make sure that you have a light by your bed that is easy to reach. Do not use any sheets or blankets that are too big for your bed. They should not hang down onto the floor. Have a firm chair that has side arms. You can use this for support while you get dressed. Do not have throw  rugs and other things on the floor that can make you trip. What can I do in the kitchen? Clean up any spills right away. Avoid walking on wet floors. Keep items that you use a lot in easy-to-reach places. If you need to reach something above you, use a strong step stool that has a grab bar. Keep electrical cords out of the way. Do not use floor polish or wax that makes floors slippery. If you must use wax, use non-skid floor wax. Do not have throw rugs and other things on the floor that can make you trip. What can I do with my stairs? Do not leave any items on the stairs. Make sure that there are handrails on both sides of the stairs and use them. Fix handrails that are broken or loose. Make sure that handrails are as long as the stairways. Check any carpeting to make sure that it is firmly attached to the stairs. Fix any carpet that is loose or worn. Avoid having throw rugs at the top or bottom of the stairs. If you do have throw rugs, attach them to the floor with carpet  tape. Make sure that you have a light switch at the top of the stairs and the bottom of the stairs. If you do not have them, ask someone to add them for you. What else can I do to help prevent falls? Wear shoes that: Do not have high heels. Have rubber bottoms. Are comfortable and fit you well. Are closed at the toe. Do not wear sandals. If you use a stepladder: Make sure that it is fully opened. Do not climb a closed stepladder. Make sure that both sides of the stepladder are locked into place. Ask someone to hold it for you, if possible. Clearly mark and make sure that you can see: Any grab bars or handrails. First and last steps. Where the edge of each step is. Use tools that help you move around (mobility aids) if they are needed. These include: Canes. Walkers. Scooters. Crutches. Turn on the lights when you go into a dark area. Replace any light bulbs as soon as they burn out. Set up your furniture so you have a clear path. Avoid moving your furniture around. If any of your floors are uneven, fix them. If there are any pets around you, be aware of where they are. Review your medicines with your doctor. Some medicines can make you feel dizzy. This can increase your chance of falling. Ask your doctor what other things that you can do to help prevent falls. This information is not intended to replace advice given to you by your health care provider. Make sure you discuss any questions you have with your health care provider. Document Released: 01/18/2009 Document Revised: 08/30/2015 Document Reviewed: 04/28/2014 Elsevier Interactive Patient Education  2017 [employer].

## 2022-01-23 NOTE — Progress Notes (Signed)
Subjective:   Tina Mendez is a 76 y.o. female who presents for Medicare Annual (Subsequent) preventive examination.  I connected with  [name] on 01/23/22 by a telephone enabled telemedicine application and verified that I am speaking with the correct person using two identifiers.   I discussed the limitations of evaluation and management by telemedicine. The patient expressed understanding and agreed to proceed.  Patient location: home  Provider location: Tele-health-home    Review of Systems           Objective:    There were no vitals filed for this visit. There is no height or weight on file to calculate BMI.     12/06/2019    2:07 PM 07/03/2017    9:38 AM 06/06/2016    8:58 AM  Advanced Directives  Does Patient Have a Medical Advance Directive? Yes Yes No  Type of Paramedic of [place];Living will [place];Living will   Copy of [place] in Chart? No - copy requested No - copy requested     Current Medications (verified) Outpatient Encounter Medications as of 01/23/2022  Medication Sig   Calcium Carbonate-Vitamin D3 600-400 MG-UNIT TABS Take one pill daily   Cholecalciferol (VITAMIN D3) 1000 units CAPS daily. Take one pill daily   cyanocobalamin 1000 MCG tablet Take by mouth.   No facility-administered encounter medications on file as of 01/23/2022.    Allergies (verified) Sulfonamide derivatives   History: Past Medical History:  Diagnosis Date   Abdominal pain 05/2018   right lower abd pain/one time   Allergy    pollen   Basal cell carcinoma of skin    mainly on face   Cancer ([place]) 2002   L BREAST/had mastectomy   Colon polyp    Hemorrhoids    hx of    History of anal fissures    Osteoporosis    Plantar fasciitis    Past Surgical History:  Procedure Laterality Date   Basal Cell Lesion  2018   removed from nose/ at [facility]   Breast CA signs - Chemo  06/2000   no  radiation   BREAST CYST EXCISION  1970   rt   [place]   1 time   COLONOSCOPY  2014   MASTECTOMY  2002   LEFT   Family History  Problem Relation Age of Onset   Diabetes Mother    Heart disease Mother    Heart failure Mother    Colon polyps Father    Cancer Paternal Aunt        breast   Cancer Paternal Uncle        Lung CA, smoker   Colon cancer Neg Hx    Breast cancer Neg Hx    Esophageal cancer Neg Hx    Rectal cancer Neg Hx    Stomach cancer Neg Hx    Social History   Socioeconomic History   Marital status: Married    Spouse name: Not on file   Number of children: Not on file   Years of education: Not on file   Highest education level: Not on file  Occupational History   Occupation: [employer]    Employer: [employer]  Tobacco Use   Smoking status: Never   Smokeless tobacco: Never  Vaping Use   Vaping Use: Never used  Substance and Sexual Activity   Alcohol use: No    Alcohol/week: 0.0 standard  drinks of alcohol   Drug use: No   Sexual activity: Yes  Other Topics Concern   Not on file  Social History Narrative   Regular exercise:  No   Social Determinants of Health   Financial Resource Strain: Low Risk  (12/06/2019)   Overall Financial Resource Strain (CARDIA)    Difficulty of Paying Living Expenses: Not hard at all  Food Insecurity: No Food Insecurity (12/06/2019)   Hunger Vital Sign    Worried About Running Out of Food in the Last Year: Never true    Ran Out of Food in the Last Year: Never true  Transportation Needs: No Transportation Needs (12/06/2019)   PRAPARE - Hydrologist (Medical): No    Lack of Transportation (Non-Medical): No  Physical Activity: Sufficiently Active (12/06/2019)   Exercise Vital Sign    Days of Exercise per Week: 5 days    Minutes of Exercise per Session: 30 min  Stress: No Stress Concern Present (12/06/2019)   [place]    Feeling of Stress : Not at all  Social Connections: Not on file    Tobacco Counseling Counseling given: Not Answered   Clinical Intake:                 Diabetic?  no         Activities of Daily Living     No data to display          Patient Care Team: Tower, Wynelle Fanny, MD as PCP - Arcelia Jew, Darrick Meigs, MD (Inactive) (General Surgery) Magrinat, Virgie Dad, MD (Inactive) (Hematology and Oncology) Eula Flax, DO as Consulting Physician (Optometry)  Indicate any recent Medical Services you may have received from other than [facility] providers in the past year (date may be approximate).     Assessment:   This is a routine wellness examination for Tina Mendez.  Hearing/Vision screen No results found.  Dietary issues and exercise activities discussed:     Goals Addressed   None    Depression Screen    01/02/2021   11:07 AM 12/06/2019    2:10 PM 12/02/2018    9:22 AM 07/03/2017    9:38 AM 06/06/2016    8:58 AM 06/01/2015    9:41 AM 05/29/2014   10:46 AM  PHQ 2/9 Scores  PHQ - 2 Score 0 0 0 0 0 0 0  PHQ- 9 Score  0  0       Fall Risk    01/02/2021   11:07 AM 12/06/2019    2:09 PM 12/02/2018    9:22 AM 07/03/2017    9:38 AM 06/06/2016    8:58 AM  Fall Risk   Falls in the past year? 0 0 0 No No  Number falls in past yr: 0 0 0    Injury with Fall? 0 0 0    Risk for fall due to :  Medication side effect     Follow up  Falls evaluation completed;Falls prevention discussed Falls evaluation completed      FALL RISK PREVENTION PERTAINING TO THE HOME:  Any stairs in or around the home? Yes  If so, are there any without handrails? No  Home free of loose throw rugs in walkways, pet beds, electrical cords, etc? Yes  Adequate lighting in your home to reduce risk of falls? Yes   ASSISTIVE DEVICES UTILIZED TO PREVENT FALLS:  Life alert? No  Use of a cane,  walker or w/c? No  Grab bars in the bathroom? Yes  Shower chair or  bench in shower? Yes  Elevated toilet seat or a handicapped toilet? Yes   TIMED UP AND GO:  Was the test performed? No .    Cognitive Function:    12/06/2019    2:12 PM 07/03/2017    9:38 AM 06/06/2016    8:58 AM  MMSE - Mini Mental State Exam  Orientation to time '5 5 5  '$ Orientation to Place '5 5 5  '$ Registration '3 3 3  '$ Attention/ Calculation 5 0 0  Recall '3 3 3  '$ Language- name 2 objects  0 0  Language- repeat '1 1 1  '$ Language- follow 3 step command  3 3  Language- read & follow direction  0 0  Write a sentence  0 0  Copy design  0 0  Total score  20 20        Immunizations Immunization History  Administered Date(s) Administered   Influenza,inj,Quad PF,6+ Mos 12/02/2018   PFIZER(Purple Top)SARS-COV-2 Vaccination 06/06/2019   Pneumococcal Conjugate-13 06/01/2015   Pneumococcal Polysaccharide-23 05/29/2014   Td 01/27/2003, 11/17/2012    TDAP status: Up to date  Flu Vaccine status: Declined, Education has been provided regarding the importance of this vaccine but patient still declined. Advised may receive this vaccine at local pharmacy or Health Dept. Aware to provide a copy of the vaccination record if obtained from local pharmacy or Health Dept. Verbalized acceptance and understanding.  Pneumococcal vaccine status: Up to date  Covid-19 vaccine status: Declined, Education has been provided regarding the importance of this vaccine but patient still declined. Advised may receive this vaccine at local pharmacy or Health Dept.or vaccine clinic. Aware to provide a copy of the vaccination record if obtained from local pharmacy or Health Dept. Verbalized acceptance and understanding.  Qualifies for Shingles Vaccine? Yes   Zostavax completed No   Shingrix Completed?: No.    Education has been provided regarding the importance of this vaccine. Patient has been advised to call insurance company to determine out of pocket expense if they have not yet received this vaccine. Advised may  also receive vaccine at local pharmacy or Health Dept. Verbalized acceptance and understanding.  Screening Tests Health Maintenance  Topic Date Due   Zoster Vaccines- Shingrix (1 of 2) Never done   COVID-19 Vaccine (2 - Pfizer risk series) 06/27/2019   INFLUENZA VACCINE  11/05/2021   MAMMOGRAM  07/10/2022   TETANUS/TDAP  11/18/2022   COLONOSCOPY (Pts 45-39yr Insurance coverage will need to be confirmed)  08/07/2025   Pneumonia Vaccine 75 Years old  Completed   DEXA SCAN  Completed   Hepatitis C Screening  Completed   HPV VACCINES  Aged Out   PAP SMEAR-Modifier  Discontinued    Health Maintenance  Health Maintenance Due  Topic Date Due   Zoster Vaccines- Shingrix (1 of 2) Never done   COVID-19 Vaccine (2 - Pfizer risk series) 06/27/2019   INFLUENZA VACCINE  11/05/2021    Colorectal cancer screening: Type of screening: Colonoscopy. Completed 2022. Repeat every 5 years  Mammogram status: Completed 2023. Repeat every year  Bone Density   Delcined  Lung Cancer Screening: (Low Dose CT Chest recommended if Age 75-80years, 30 pack-year currently smoking OR have quit w/in 15years.) does not qualify.   Lung Cancer Screening Referral:   Additional Screening:  Hepatitis C Screening: does not qualify; Completed 2019  Vision Screening: Recommended annual ophthalmology exams for early detection  of glaucoma and other disorders of the eye. Is the patient up to date with their annual eye exam?  Yes  Who is the provider or what is the name of the office in which the patient attends annual eye exams? [facility] If pt is not established with a provider, would they like to be referred to a provider to establish care? No .   Dental Screening: Recommended annual dental exams for proper oral hygiene  Community Resource Referral / Chronic Care Management: CRR required this visit?  No   CCM required this visit?  No      Plan:     I have personally reviewed and noted the following in the  patient's chart:   Medical and social history Use of alcohol, tobacco or illicit drugs  Current medications and supplements including opioid prescriptions. Patient is not currently taking opioid prescriptions. Functional ability and status Nutritional status Physical activity Advanced directives List of other physicians Hospitalizations, surgeries, and ER visits in previous 12 months Vitals Screenings to include cognitive, depression, and falls Referrals and appointments  In addition, I have reviewed and discussed with patient certain preventive protocols, quality metrics, and best practice recommendations. A written personalized care plan for preventive services as well as general preventive health recommendations were provided to patient.     Leroy Kennedy, LPN   [phone]   Nurse Notes:

## 2022-01-23 NOTE — Telephone Encounter (Signed)
-----   Message from Ellamae Sia sent at 01/13/2022  4:16 PM EDT ----- Regarding: lab orders for Friday, 10.20.23 Patient is scheduled for CPX labs, please order future labs, Thanks , Karna Christmas

## 2022-01-24 ENCOUNTER — Other Ambulatory Visit (INDEPENDENT_AMBULATORY_CARE_PROVIDER_SITE_OTHER): Payer: Medicare Other

## 2022-01-24 DIAGNOSIS — E538 Deficiency of other specified B group vitamins: Secondary | ICD-10-CM

## 2022-01-24 DIAGNOSIS — E559 Vitamin D deficiency, unspecified: Secondary | ICD-10-CM

## 2022-01-24 DIAGNOSIS — E7800 Pure hypercholesterolemia, unspecified: Secondary | ICD-10-CM | POA: Diagnosis not present

## 2022-01-24 DIAGNOSIS — E875 Hyperkalemia: Secondary | ICD-10-CM | POA: Diagnosis not present

## 2022-01-24 LAB — LIPID PANEL
Cholesterol: 224 mg/dL — ABNORMAL HIGH (ref 0–200)
HDL: 79.1 mg/dL (ref >39.00)
LDL Cholesterol: 134 mg/dL — ABNORMAL HIGH (ref 0–99)
NonHDL: 144.89
Total CHOL/HDL Ratio: 3
Triglycerides: 54 mg/dL (ref 0.0–149.0)
VLDL: 10.8 mg/dL (ref 0.0–40.0)

## 2022-01-24 LAB — COMPREHENSIVE METABOLIC PANEL
ALT: 11 U/L (ref 0–35)
AST: 17 U/L (ref 0–37)
Albumin: 4.4 g/dL (ref 3.5–5.2)
Alkaline Phosphatase: 65 U/L (ref 39–117)
BUN: 15 mg/dL (ref 6–23)
CO2: 27 mEq/L (ref 19–32)
Calcium: 9.6 mg/dL (ref 8.4–10.5)
Chloride: 107 mEq/L (ref 96–112)
Creatinine, Ser: 0.73 mg/dL (ref 0.40–1.20)
GFR: 80.48 mL/min (ref >60.00)
Glucose, Bld: 92 mg/dL (ref 70–99)
Potassium: 4.7 mEq/L (ref 3.5–5.1)
Sodium: 142 mEq/L (ref 135–145)
Total Bilirubin: 0.8 mg/dL (ref 0.2–1.2)
Total Protein: 6.4 g/dL (ref 6.0–8.3)

## 2022-01-24 LAB — VITAMIN D 25 HYDROXY (VIT D DEFICIENCY, FRACTURES): VITD: 37.74 ng/mL (ref 30.00–100.00)

## 2022-01-24 LAB — CBC WITH DIFFERENTIAL/PLATELET
Basophils Absolute: 0.1 10*3/uL (ref 0.0–0.1)
Basophils Relative: 1.5 % (ref 0.0–3.0)
Eosinophils Absolute: 0.1 10*3/uL (ref 0.0–0.7)
Eosinophils Relative: 2.7 % (ref 0.0–5.0)
HCT: 40.5 % (ref 36.0–46.0)
Hemoglobin: 13.6 g/dL (ref 12.0–15.0)
Lymphocytes Relative: 28.2 % (ref 12.0–46.0)
Lymphs Abs: 1.1 10*3/uL (ref 0.7–4.0)
MCHC: 33.6 g/dL (ref 30.0–36.0)
MCV: 95.8 fl (ref 78.0–100.0)
Monocytes Absolute: 0.2 10*3/uL (ref 0.1–1.0)
Monocytes Relative: 5.9 % (ref 3.0–12.0)
Neutro Abs: 2.5 10*3/uL (ref 1.4–7.7)
Neutrophils Relative %: 61.7 % (ref 43.0–77.0)
Platelets: 180 10*3/uL (ref 150.0–400.0)
RBC: 4.23 Mil/uL (ref 3.87–5.11)
RDW: 13.1 % (ref 11.5–15.5)
WBC: 4 10*3/uL (ref 4.0–10.5)

## 2022-01-24 LAB — TSH: TSH: 1.51 u[IU]/mL (ref 0.35–5.50)

## 2022-01-24 LAB — VITAMIN B12: Vitamin B-12: 466 pg/mL (ref 211–911)

## 2022-01-29 ENCOUNTER — Encounter: Payer: Self-pay | Admitting: Family Medicine

## 2022-01-29 ENCOUNTER — Ambulatory Visit (INDEPENDENT_AMBULATORY_CARE_PROVIDER_SITE_OTHER): Payer: Medicare Other | Admitting: Family Medicine

## 2022-01-29 VITALS — BP 110/68 | HR 71 | Temp 97.8°F | Ht 61.5 in | Wt 129.5 lb

## 2022-01-29 DIAGNOSIS — E7800 Pure hypercholesterolemia, unspecified: Secondary | ICD-10-CM

## 2022-01-29 DIAGNOSIS — M810 Age-related osteoporosis without current pathological fracture: Secondary | ICD-10-CM | POA: Diagnosis not present

## 2022-01-29 DIAGNOSIS — Z0000 Encounter for general adult medical examination without abnormal findings: Secondary | ICD-10-CM | POA: Diagnosis not present

## 2022-01-29 DIAGNOSIS — E559 Vitamin D deficiency, unspecified: Secondary | ICD-10-CM

## 2022-01-29 DIAGNOSIS — E2839 Other primary ovarian failure: Secondary | ICD-10-CM | POA: Diagnosis not present

## 2022-01-29 DIAGNOSIS — E538 Deficiency of other specified B group vitamins: Secondary | ICD-10-CM

## 2022-01-29 NOTE — Patient Instructions (Addendum)
Consider starting some strength training  Weights or exercise bands at home (lots of videos on line)  Going a gym /machines or trainer is even better   Keep walking  Make sure you eat enough protein (with every meal)   For cholesterol Avoid red meat/ fried foods/ egg yolks/ fatty breakfast meats/ butter, cheese and high fat dairy/ and shellfish   Think about cutting the red meat and shrimp    Wear your support socks for the veins    Please schedule your bone density test   Please call the location of your choice from the menu below to schedule your Mammogram and/or Bone Density appointment.    [facility]                      Phone:  [phone] [address], [place] [address]                                                             Services: Traditional and 3D Mammogram, [place] [facility] Density                 Phone: [phone] [address], [place] [address]    Service: Bone Density ONLY   *this site does NOT perform mammograms  [facility]                        Phone:  [phone] [address], [place] [address]                                            Services:  3D Mammogram and [place] at [facility]   Phone:  [phone]   [address], [place] [address]  Services: 3D Mammogram and Bone Density  [place] [facility])  Phone:  [phone]   [address]                        [place], [place] [address]                                              Services:  3D  Mammogram and Bone Density    Please look at the info on alendronate and let us know if you want to try it

## 2022-01-29 NOTE — Assessment & Plan Note (Signed)
Reviewed health habits including diet and exercise and skin cancer prevention Reviewed appropriate screening tests for age  Also reviewed health mt list, fam hx and immunization status , as well as social and family history   See HPI Declines flu shot  Mammogram utd 07/2021 colonosocpy utd 08/2020 with 5 y recall Dexa ordered, pt will call to schedule it , no falls or fx

## 2022-01-29 NOTE — Progress Notes (Signed)
Subjective:    Patient ID: Tina Mendez, female    DOB: 10-30-1945, 76 y.o.   MRN: [id number]  HPI Pt presents for annual f/u of chronic medical problems   Wt Readings from Last 3 Encounters:  01/29/22 129 lb 8 oz (58.7 kg)  01/02/21 133 lb (60.3 kg)  08/07/20 134 lb (60.8 kg)   24.07 kg/m  Doing well  Taking care of grand kids and traveling  Has to go to [place] frequently   Taking care of herself  Trying to walk every day for exercise   Has a treadmill  Eating protein   Trying to cut back on bread  Not much sweets   Basal cell lesions removed from legs  Some spider veins   Does not sleep well as a rule    Immunization History  Administered Date(s) Administered   Influenza,inj,Quad PF,6+ Mos 12/02/2018   PFIZER(Purple Top)SARS-COV-2 Vaccination 06/06/2019   Pneumococcal Conjugate-13 06/01/2015   Pneumococcal Polysaccharide-23 05/29/2014   Td 01/27/2003, 11/17/2012   There are no preventive care reminders to display for this patient.  Declines flu shot   Mammogram 07/2021  Self breast exam: no lumps   Colonoscopy 08/2020 with 5 y recall   Dexa  12/2018 - OP Falls: none  Fractures: none  Supplements ca and D  D level is 37.7  Exercise walking   Held off on fosamax due to dental work  Had implant and then some gum problems  She gets deep cleaning now - visit every 4 months    BP Readings from Last 3 Encounters:  01/29/22 110/68  01/02/21 116/72  08/07/20 113/67   Pulse Readings from Last 3 Encounters:  01/29/22 71  01/02/21 62  08/07/20 69     Vit B12 def Lab Results  Component Value Date   VITAMINB12 466 01/24/2022   Hyperlipidemia Lab Results  Component Value Date   CHOL 224 (H) 01/24/2022   CHOL 224 (H) 12/12/2020   CHOL 213 (H) 12/05/2019   Lab Results  Component Value Date   HDL 79.10 01/24/2022   HDL 68.40 12/12/2020   HDL 66.50 12/05/2019   Lab Results  Component Value Date   LDLCALC 134 (H) 01/24/2022   LDLCALC  140 (H) 12/12/2020   LDLCALC 134 (H) 12/05/2019   Lab Results  Component Value Date   TRIG 54.0 01/24/2022   TRIG 76.0 12/12/2020   TRIG 62.0 12/05/2019   Lab Results  Component Value Date   CHOLHDL 3 01/24/2022   CHOLHDL 3 12/12/2020   CHOLHDL 3 12/05/2019   Lab Results  Component Value Date   LDLDIRECT 145.7 05/20/2013   LDLDIRECT 153.9 11/11/2012   LDLDIRECT 152.9 09/29/2008   Pt declines medicine in past   LDL is down a bit to 134 Healthy eater  Lots of fruits and vegetables  Occ a little bacon  Eats chicken  Red meat - once per week Some fried shrimp twice per month  Some cheese   The 10-year ASCVD risk score (Arnett DK, et al., 2019) is: 12.4%   Values used to calculate the score:     Age: 23 years     Sex: Female     Is Non-Hispanic African American: No     Diabetic: No     Tobacco smoker: No     Systolic Blood Pressure: 272 mmHg     Is BP treated: No     HDL Cholesterol: 79.1 mg/dL     Total Cholesterol: 224 mg/dL  Mother had heart disease and HTN and DM Sister is on cholesterol medicine    Other labs Lab Results  Component Value Date   WBC 4.0 01/24/2022   HGB 13.6 01/24/2022   HCT 40.5 01/24/2022   MCV 95.8 01/24/2022   PLT 180.0 01/24/2022   Lab Results  Component Value Date   TSH 1.51 01/24/2022   Lab Results  Component Value Date   CREATININE 0.73 01/24/2022   BUN 15 01/24/2022   NA 142 01/24/2022   K 4.7 01/24/2022   CL 107 01/24/2022   CO2 27 01/24/2022    Lab Results  Component Value Date   ALT 11 01/24/2022   AST 17 01/24/2022   ALKPHOS 65 01/24/2022   BILITOT 0.8 01/24/2022    Patient Active Problem List   Diagnosis Date Noted   Neuropathy 12/07/2019   B12 deficiency 12/07/2019   Hyperkalemia 12/02/2019   Allergic reaction 10/17/2016   Estrogen deficiency 06/06/2016   Routine general medical examination at a health care facility 05/24/2015   Vitamin D deficiency 05/29/2014   Encounter for routine gynecological  examination 11/17/2012   Encounter for Medicare annual wellness exam 11/10/2012   PURE HYPERCHOLESTEROLEMIA 06/23/2008   Osteoporosis 06/16/2008   hx: breast cancer, left IDC, 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 + 06/16/2008   Past Medical History:  Diagnosis Date   Abdominal pain 05/2018   right lower abd pain/one time   Allergy    pollen   Basal cell carcinoma of skin    mainly on face   Cancer ([place]) 2002   L BREAST/had mastectomy   Colon polyp    Hemorrhoids    hx of    History of anal fissures    Osteoporosis    Plantar fasciitis    Past Surgical History:  Procedure Laterality Date   Basal Cell Lesion  2018   removed from nose/ at [facility]   Breast CA signs - Chemo  06/2000   no radiation   BREAST CYST EXCISION  1970   rt   [place]   1 time   COLONOSCOPY  2014   MASTECTOMY  2002   LEFT   Social History   Tobacco Use   Smoking status: Never   Smokeless tobacco: Never  Vaping Use   Vaping Use: Never used  Substance Use Topics   Alcohol use: No    Alcohol/week: 0.0 standard drinks of alcohol   Drug use: No   Family History  Problem Relation Age of Onset   Diabetes Mother    Heart disease Mother    Heart failure Mother    Colon polyps Father    Cancer Paternal Aunt        breast   Cancer Paternal Uncle        Lung CA, smoker   Colon cancer Neg Hx    Breast cancer Neg Hx    Esophageal cancer Neg Hx    Rectal cancer Neg Hx    Stomach cancer Neg Hx    Allergies  Allergen Reactions   Sulfonamide Derivatives Nausea Only   Current Outpatient Medications on File Prior to Visit  Medication Sig Dispense Refill   Calcium Carb-Cholecalciferol (CALCIUM + VITAMIN D3) 600-5 MG-MCG TABS Take 1 tablet by mouth daily.     Cholecalciferol (VITAMIN D3) 1000 units CAPS daily. Take one pill daily     cyanocobalamin (VITAMIN B12) 500 MCG tablet Take 500 mcg by mouth daily.     No current facility-administered  medications on file prior to visit.      Review of  Systems  Constitutional:  Negative for activity change, appetite change, fatigue, fever and unexpected weight change.  HENT:  Negative for congestion, ear pain, rhinorrhea, sinus pressure and sore throat.   Eyes:  Negative for pain, redness and visual disturbance.  Respiratory:  Negative for cough, shortness of breath and wheezing.   Cardiovascular:  Negative for chest pain and palpitations.  Gastrointestinal:  Negative for abdominal pain, blood in stool, constipation and diarrhea.  Endocrine: Negative for polydipsia and polyuria.  Genitourinary:  Negative for dysuria, frequency and urgency.  Musculoskeletal:  Negative for arthralgias, back pain and myalgias.  Skin:  Negative for pallor and rash.  Allergic/Immunologic: Negative for environmental allergies.  Neurological:  Negative for dizziness, syncope and headaches.  Hematological:  Negative for adenopathy. Does not bruise/bleed easily.  Psychiatric/Behavioral:  Positive for sleep disturbance. Negative for decreased concentration and dysphoric mood. The patient is not nervous/anxious.        Objective:   Physical Exam Constitutional:      General: She is not in acute distress.    Appearance: Normal appearance. She is well-developed and normal weight. She is not ill-appearing or diaphoretic.  HENT:     Head: Normocephalic and atraumatic.     Right Ear: Tympanic membrane, ear canal and external ear normal.     Left Ear: Tympanic membrane, ear canal and external ear normal.     Nose: Nose normal. No congestion.     Mouth/Throat:     Mouth: Mucous membranes are moist.     Pharynx: Oropharynx is clear. No posterior oropharyngeal erythema.  Eyes:     General: No scleral icterus.    Extraocular Movements: Extraocular movements intact.     Conjunctiva/sclera: Conjunctivae normal.     Pupils: Pupils are equal, round, and reactive to light.  Neck:     Thyroid: No thyromegaly.     Vascular: No carotid bruit or JVD.  Cardiovascular:      Rate and Rhythm: Normal rate and regular rhythm.     Pulses: Normal pulses.     Heart sounds: Normal heart sounds.     No gallop.  Pulmonary:     Effort: Pulmonary effort is normal. No respiratory distress.     Breath sounds: Normal breath sounds. No wheezing.     Comments: Good air exch Chest:     Chest wall: No tenderness.  Abdominal:     General: Bowel sounds are normal. There is no distension or abdominal bruit.     Palpations: Abdomen is soft. There is no mass.     Tenderness: There is no abdominal tenderness.     Hernia: No hernia is present.  Genitourinary:    Comments: Breast exam: No mass, nodules, thickening, tenderness, bulging, retraction, inflamation, nipple discharge or skin changes noted.  No axillary or clavicular LA.     Musculoskeletal:        General: No tenderness. Normal range of motion.     Cervical back: Normal range of motion and neck supple. No rigidity. No muscular tenderness.     Right lower leg: No edema.     Left lower leg: No edema.     Comments: No kyphosis   Lymphadenopathy:     Cervical: No cervical adenopathy.  Skin:    General: Skin is warm and dry.     Coloration: Skin is not pale.     Findings: No erythema or rash.  Comments: Solar lentigines diffusely   Neurological:     Mental Status: She is alert. Mental status is at baseline.     Cranial Nerves: No cranial nerve deficit.     Motor: No abnormal muscle tone.     Coordination: Coordination normal.     Gait: Gait normal.     Deep Tendon Reflexes: Reflexes are normal and symmetric. Reflexes normal.  Psychiatric:        Mood and Affect: Mood normal.        Cognition and Memory: Cognition and memory normal.           Assessment & Plan:   Problem List Items Addressed This Visit       Musculoskeletal and Integument   Osteoporosis    Given info on alendronate to read given that her dental work is done  Will let us know if open to that for 5 y course  dexa ordered  No falls or  fx  Taking ca and D , D level is in low nl range , enc her to add strength training        Relevant Medications   Calcium Carb-Cholecalciferol (CALCIUM + VITAMIN D3) 600-5 MG-MCG TABS     Other   B12 deficiency    Lab Results  Component Value Date   VITAMINB12 466 01/24/2022  Enc to continue current vit B12       Estrogen deficiency   Relevant Orders   DG Bone Density   PURE HYPERCHOLESTEROLEMIA    Disc goals for lipids and reasons to control them Rev last labs with pt Rev low sat fat diet in detail LDL is 134 Reviewed diet  If this rises may consider statin  She would rather avoid medicine       Routine general medical examination at a health care facility - Primary    Reviewed health habits including diet and exercise and skin cancer prevention Reviewed appropriate screening tests for age  Also reviewed health mt list, fam hx and immunization status , as well as social and family history   See HPI Declines flu shot  Mammogram utd 07/2021 colonosocpy utd 08/2020 with 5 y recall Dexa ordered, pt will call to schedule it , no falls or fx        Vitamin D deficiency    Vitamin D level is therapeutic with current supplementation Disc importance of this to bone and overall health In low nl range

## 2022-01-29 NOTE — Assessment & Plan Note (Signed)
Lab Results  Component Value Date   [id number] 01/24/2022   Enc to continue current vit B12

## 2022-01-29 NOTE — Assessment & Plan Note (Signed)
Vitamin D level is therapeutic with current supplementation Disc importance of this to bone and overall health In low nl range

## 2022-01-29 NOTE — Assessment & Plan Note (Signed)
Disc goals for lipids and reasons to control them Rev last labs with pt Rev low sat fat diet in detail LDL is 134 Reviewed diet  If this rises may consider statin  She would rather avoid medicine

## 2022-01-29 NOTE — Assessment & Plan Note (Signed)
Given info on alendronate to read given that her dental work is done  Will let us know if open to that for 5 y course  dexa ordered  No falls or fx  Taking ca and D , D level is in low nl range , enc her to add strength training

## 2022-07-01 DIAGNOSIS — D225 Melanocytic nevi of trunk: Secondary | ICD-10-CM | POA: Diagnosis not present

## 2022-07-01 DIAGNOSIS — C44719 Basal cell carcinoma of skin of left lower limb, including hip: Secondary | ICD-10-CM | POA: Diagnosis not present

## 2022-07-01 DIAGNOSIS — L821 Other seborrheic keratosis: Secondary | ICD-10-CM | POA: Diagnosis not present

## 2022-07-01 DIAGNOSIS — L814 Other melanin hyperpigmentation: Secondary | ICD-10-CM | POA: Diagnosis not present

## 2022-07-01 DIAGNOSIS — L578 Other skin changes due to chronic exposure to nonionizing radiation: Secondary | ICD-10-CM | POA: Diagnosis not present

## 2022-07-01 DIAGNOSIS — Z85828 Personal history of other malignant neoplasm of skin: Secondary | ICD-10-CM | POA: Diagnosis not present

## 2022-07-01 DIAGNOSIS — D2270 Melanocytic nevi of unspecified lower limb, including hip: Secondary | ICD-10-CM | POA: Diagnosis not present

## 2022-07-01 DIAGNOSIS — L570 Actinic keratosis: Secondary | ICD-10-CM | POA: Diagnosis not present

## 2022-07-01 DIAGNOSIS — Z1283 Encounter for screening for malignant neoplasm of skin: Secondary | ICD-10-CM | POA: Diagnosis not present

## 2022-07-01 DIAGNOSIS — D2260 Melanocytic nevi of unspecified upper limb, including shoulder: Secondary | ICD-10-CM | POA: Diagnosis not present

## 2022-07-01 DIAGNOSIS — L565 Disseminated superficial actinic porokeratosis (DSAP): Secondary | ICD-10-CM | POA: Diagnosis not present

## 2022-07-21 ENCOUNTER — Other Ambulatory Visit: Payer: Self-pay | Admitting: Family Medicine

## 2022-07-21 DIAGNOSIS — Z1231 Encounter for screening mammogram for malignant neoplasm of breast: Secondary | ICD-10-CM

## 2022-07-24 ENCOUNTER — Ambulatory Visit
Admission: RE | Admit: 2022-07-24 | Discharge: 2022-07-24 | Disposition: A | Discharge status: Home / Self Care | Payer: Medicare Other | Source: Ambulatory Visit | Attending: Family Medicine | Admitting: Family Medicine

## 2022-07-24 DIAGNOSIS — Z1231 Encounter for screening mammogram for malignant neoplasm of breast: Secondary | ICD-10-CM | POA: Diagnosis not present

## 2022-12-26 ENCOUNTER — Ambulatory Visit (INDEPENDENT_AMBULATORY_CARE_PROVIDER_SITE_OTHER): Payer: Medicare Other | Admitting: Family Medicine

## 2022-12-26 VITALS — BP 116/58 | HR 56 | Temp 97.1°F | Ht 61.5 in | Wt 133.0 lb

## 2022-12-26 DIAGNOSIS — S81812A Laceration without foreign body, left lower leg, initial encounter: Secondary | ICD-10-CM

## 2022-12-26 NOTE — Progress Notes (Unsigned)
On left lower leg x 2 weeks. Patient cut leg on a branch when doing yard work. She has been using Vaseline and covering with bandage daily. Not painful.  No FCNAVD.  No drainage.  She had a superficial area where the skin was peeled back where the branch scraped her leg.  She had a sock on at the time.  It didn't puncture the sock.   Meds, vitals, and allergies reviewed.   ROS: Per HPI unless specifically indicated in ROS section   Nad ncat L anterior shin with 1.5cm scrape/reapproximated skin tear. Locally pink but not red.  No fluctuant mass or drainage.  Doesn't appear infected.   Trace BLE edema, d/w pt about restarting compression stocking use.

## 2022-12-26 NOTE — Patient Instructions (Signed)
Use a hyrdocolloid dressing.  It can stay on for up to a few days at a time.  Change as needed.  Restart compression stockings during the day.  Update [place] as needed.  Take care.  Glad to see you.

## 2022-12-28 DIAGNOSIS — S81812A Laceration without foreign body, left lower leg, initial encounter: Secondary | ICD-10-CM | POA: Insufficient documentation

## 2022-12-28 NOTE — Assessment & Plan Note (Signed)
Trace BLE edema, d/w pt about restarting compression stocking use.   Does not appear infected.  Discussed options.  Covered with hydrocolloid dressing, she can change every few days, sooner if needed.  This should resolve.  Update [place] as needed.

## 2023-01-12 ENCOUNTER — Other Ambulatory Visit: Payer: Self-pay | Admitting: Family Medicine

## 2023-01-12 DIAGNOSIS — E2839 Other primary ovarian failure: Secondary | ICD-10-CM

## 2023-02-01 ENCOUNTER — Telehealth: Payer: Self-pay | Admitting: Family Medicine

## 2023-02-01 DIAGNOSIS — G629 Polyneuropathy, unspecified: Secondary | ICD-10-CM

## 2023-02-01 DIAGNOSIS — E7800 Pure hypercholesterolemia, unspecified: Secondary | ICD-10-CM

## 2023-02-01 DIAGNOSIS — M810 Age-related osteoporosis without current pathological fracture: Secondary | ICD-10-CM

## 2023-02-01 DIAGNOSIS — E559 Vitamin D deficiency, unspecified: Secondary | ICD-10-CM

## 2023-02-01 DIAGNOSIS — E538 Deficiency of other specified B group vitamins: Secondary | ICD-10-CM

## 2023-02-01 NOTE — Telephone Encounter (Signed)
-----   Message from Alvina Chou sent at 01/15/2023  4:00 PM EDT ----- Regarding: Lab orders for Mon, 10.28.24 Patient is scheduled for CPX labs, please order future labs, Thanks , Camelia Eng

## 2023-02-02 ENCOUNTER — Other Ambulatory Visit (INDEPENDENT_AMBULATORY_CARE_PROVIDER_SITE_OTHER): Payer: Medicare Other

## 2023-02-02 DIAGNOSIS — E538 Deficiency of other specified B group vitamins: Secondary | ICD-10-CM

## 2023-02-02 DIAGNOSIS — E7800 Pure hypercholesterolemia, unspecified: Secondary | ICD-10-CM

## 2023-02-02 DIAGNOSIS — G629 Polyneuropathy, unspecified: Secondary | ICD-10-CM

## 2023-02-02 DIAGNOSIS — E559 Vitamin D deficiency, unspecified: Secondary | ICD-10-CM

## 2023-02-02 LAB — CBC WITH DIFFERENTIAL/PLATELET
Basophils Absolute: 0 10*3/uL (ref 0.0–0.1)
Basophils Relative: 1 % (ref 0.0–3.0)
Eosinophils Absolute: 0.1 10*3/uL (ref 0.0–0.7)
Eosinophils Relative: 2.5 % (ref 0.0–5.0)
HCT: 42.1 % (ref 36.0–46.0)
Hemoglobin: 13.9 g/dL (ref 12.0–15.0)
Lymphocytes Relative: 28.8 % (ref 12.0–46.0)
Lymphs Abs: 1.3 10*3/uL (ref 0.7–4.0)
MCHC: 32.9 g/dL (ref 30.0–36.0)
MCV: 96.7 fL (ref 78.0–100.0)
Monocytes Absolute: 0.3 10*3/uL (ref 0.1–1.0)
Monocytes Relative: 6.8 % (ref 3.0–12.0)
Neutro Abs: 2.8 10*3/uL (ref 1.4–7.7)
Neutrophils Relative %: 60.9 % (ref 43.0–77.0)
Platelets: 181 10*3/uL (ref 150.0–400.0)
RBC: 4.36 Mil/uL (ref 3.87–5.11)
RDW: 13 % (ref 11.5–15.5)
WBC: 4.5 10*3/uL (ref 4.0–10.5)

## 2023-02-02 LAB — COMPREHENSIVE METABOLIC PANEL
ALT: 12 U/L (ref 0–35)
AST: 18 U/L (ref 0–37)
Albumin: 4.3 g/dL (ref 3.5–5.2)
Alkaline Phosphatase: 64 U/L (ref 39–117)
BUN: 13 mg/dL (ref 6–23)
CO2: 29 meq/L (ref 19–32)
Calcium: 9.7 mg/dL (ref 8.4–10.5)
Chloride: 106 meq/L (ref 96–112)
Creatinine, Ser: 0.76 mg/dL (ref 0.40–1.20)
GFR: 76.14 mL/min (ref >60.00)
Glucose, Bld: 97 mg/dL (ref 70–99)
Potassium: 4.8 meq/L (ref 3.5–5.1)
Sodium: 141 meq/L (ref 135–145)
Total Bilirubin: 1 mg/dL (ref 0.2–1.2)
Total Protein: 6.4 g/dL (ref 6.0–8.3)

## 2023-02-02 LAB — LIPID PANEL
Cholesterol: 219 mg/dL — ABNORMAL HIGH (ref 0–200)
HDL: 74.5 mg/dL (ref >39.00)
LDL Cholesterol: 127 mg/dL — ABNORMAL HIGH (ref 0–99)
NonHDL: 144.82
Total CHOL/HDL Ratio: 3
Triglycerides: 88 mg/dL (ref 0.0–149.0)
VLDL: 17.6 mg/dL (ref 0.0–40.0)

## 2023-02-02 LAB — VITAMIN D 25 HYDROXY (VIT D DEFICIENCY, FRACTURES): VITD: 44.28 ng/mL (ref 30.00–100.00)

## 2023-02-02 LAB — TSH: TSH: 1.77 u[IU]/mL (ref 0.35–5.50)

## 2023-02-02 LAB — VITAMIN B12: Vitamin B-12: 440 pg/mL (ref 211–911)

## 2023-02-04 ENCOUNTER — Ambulatory Visit: Payer: Medicare Other

## 2023-02-04 VITALS — Ht 61.0 in | Wt 130.0 lb

## 2023-02-04 DIAGNOSIS — Z0000 Encounter for general adult medical examination without abnormal findings: Secondary | ICD-10-CM

## 2023-02-04 NOTE — Patient Instructions (Signed)
[name] , Thank you for taking time to come for your Medicare Wellness Visit. I appreciate your ongoing commitment to your health goals. Please review the following plan we discussed and let me know if I can assist you in the future.   Referrals/Orders/Follow-Ups/Clinician Recommendations: Aim for 30 minutes of exercise or brisk walking, 6-8 glasses of water, and 5 servings of fruits and vegetables each day.   This is a list of the screening recommended for you and due dates:  Health Maintenance  Topic Date Due   Zoster (Shingles) Vaccine (1 of 2) Never done   Pap Smear  11/18/2014   DTaP/Tdap/Td vaccine (3 - Tdap) 11/18/2022   Flu Shot  07/06/2023*   COVID-19 Vaccine (2 - Pfizer risk series) 01/11/2028*   Mammogram  07/24/2023   Medicare Annual Wellness Visit  02/04/2024   Colon Cancer Screening  08/07/2025   Pneumonia Vaccine  Completed   DEXA scan (bone density measurement)  Completed   Hepatitis C Screening  Completed   HPV Vaccine  Aged Out  *Topic was postponed. The date shown is not the original due date.    Advanced directives: (In Chart) A copy of your advanced directives are scanned into your chart should your provider ever need it.  Next Medicare Annual Wellness Visit scheduled for next year: Yes  Insert Preventive Care attachment Insert FALL PREVENTION attachment if needed

## 2023-02-04 NOTE — Progress Notes (Signed)
Subjective:   Tina Mendez is a 77 y.o. female who presents for Medicare Annual (Subsequent) preventive examination.  Visit Complete: Virtual I connected with  Lillette Boxer on 02/04/23 by a audio enabled telemedicine application and verified that I am speaking with the correct person using two identifiers.  Patient Location: Home  Provider Location: Home Office  I discussed the limitations of evaluation and management by telemedicine. The patient expressed understanding and agreed to proceed.  Vital Signs: Because this visit was a virtual/telehealth visit, some criteria may be missing or patient reported. Any vitals not documented were not able to be obtained and vitals that have been documented are patient reported.  Patient Medicare AWV questionnaire was completed by the patient on 02/04/2023; I have confirmed that all information answered by patient is correct and no changes since this date.  Cardiac Risk Factors include: advanced age (>73men, >78 women)     Objective:    Today's Vitals   02/04/23 1059  Weight: 130 lb (59 kg)  Height: 5\' 1"  (1.549 m)   Body mass index is 24.56 kg/m.     02/04/2023   11:02 AM 12/06/2019    2:07 PM 07/03/2017    9:38 AM 06/06/2016    8:58 AM  Advanced Directives  Does Patient Have a Medical Advance Directive? Yes Yes Yes No  Type of Estate agent of [place];Living will Healthcare Power of [place];Living will Healthcare Power of [place];Living will   Does patient want to make changes to medical advance directive? No - Patient declined     Copy of Healthcare Power of Attorney in Chart? Yes - validated most recent copy scanned in chart (See row information) No - copy requested No - copy requested     Current Medications (verified) Outpatient Encounter Medications as of 02/04/2023  Medication Sig   Calcium Carb-Cholecalciferol (CALCIUM + VITAMIN D3) 600-5 MG-MCG TABS Take 1 tablet by mouth daily.    Cholecalciferol (VITAMIN D3) 1000 units CAPS daily. Take one pill daily   cyanocobalamin (VITAMIN B12) 500 MCG tablet Take 500 mcg by mouth daily.   No facility-administered encounter medications on file as of 02/04/2023.    Allergies (verified) Sulfonamide derivatives   History: Past Medical History:  Diagnosis Date   Abdominal pain 05/2018   right lower abd pain/one time   Allergy    pollen   Basal cell carcinoma of skin    mainly on face   Cancer (HCC) 2002   L BREAST/had mastectomy   Colon polyp    Hemorrhoids    hx of    History of anal fissures    Osteoporosis    Plantar fasciitis    Past Surgical History:  Procedure Laterality Date   Basal Cell Lesion  2018   removed from nose[facility]   Breast CA signs - Chemo  06/2000   no radiation   BREAST CYST EXCISION  1970   rt   CESAREAN SECTION  1980   1 time   COLONOSCOPY  2014   MASTECTOMY  2002   LEFT   Family History  Problem Relation Age of Onset   Diabetes Mother    Heart disease Mother    Heart failure Mother    Colon polyps Father    Cancer Paternal Aunt        breast   Cancer Paternal Uncle        Lung CA, smoker   Colon cancer Neg Hx  Breast cancer Neg Hx    Esophageal cancer Neg Hx    Rectal cancer Neg Hx    Stomach cancer Neg Hx    Social History   Socioeconomic History   Marital status: Married    Spouse name: Not on file   Number of children: Not on file   Years of education: Not on file   Highest education level: Bachelor's degree (e.g., BA, AB, BS)  Occupational History   Occupation: [employer]    Employer: [employer]  Tobacco Use   Smoking status: Never   Smokeless tobacco: Never  Vaping Use   Vaping status: Never Used  Substance and Sexual Activity   Alcohol use: No    Alcohol/week: 0.0 standard drinks of alcohol   Drug use: No   Sexual activity: Yes  Other Topics Concern   Not on file  Social History Narrative   Regular exercise:  No    Social Determinants of Health   Financial Resource Strain: Low Risk  (02/04/2023)   Overall Financial Resource Strain (CARDIA)    Difficulty of Paying Living Expenses: Not hard at all  Food Insecurity: No Food Insecurity (02/04/2023)   Hunger Vital Sign    Worried About Running Out of Food in the Last Year: Never true    Ran Out of Food in the Last Year: Never true  Transportation Needs: No Transportation Needs (02/04/2023)   PRAPARE - Administrator, Civil Service (Medical): No    Lack of Transportation (Non-Medical): No  Physical Activity: Insufficiently Active (02/04/2023)   Exercise Vital Sign    Days of Exercise per Week: 3 days    Minutes of Exercise per Session: 30 min  Stress: No Stress Concern Present (02/04/2023)   [employer] of Occupational Health - Occupational Stress Questionnaire    Feeling of Stress : Not at all  Social Connections: Moderately Integrated (02/04/2023)   Social Connection and Isolation Panel [NHANES]    Frequency of Communication with Friends and Family: More than three times a week    Frequency of Social Gatherings with Friends and Family: More than three times a week    Attends Religious Services: More than 4 times per year    Active Member of [employer] or Organizations: No    Attends Engineer, structural: Never    Marital Status: Married    Tobacco Counseling Counseling given: Not Answered   Clinical Intake:  Pre-visit preparation completed: Yes  Pain : No/denies pain     Nutritional Risks: None Diabetes: No  How often do you need to have someone help you when you read instructions, pamphlets, or other written materials from your doctor or pharmacy?: 1 - Never  Interpreter Needed?: No  Information entered by :: Renie Ora, LPN   Activities of Daily Living    02/04/2023   11:02 AM 01/29/2023    9:22 PM  In your present state of health, do you have any difficulty performing the following activities:   Hearing? 0 0  Vision? 0 0  Difficulty concentrating or making decisions? 0 0  Walking or climbing stairs? 0 0  Dressing or bathing? 0 0  Doing errands, shopping? 0 0  Preparing Food and eating ? N N  Using the Toilet? N N  In the past six months, have you accidently leaked urine? N N  Do you have problems with loss of bowel control? N N  Managing your Medications? N N  Managing your Finances? N  N  Housekeeping or managing your Housekeeping? N N    Patient Care Team: Tower, Audrie Gallus, MD as PCP - Nathanial Millman, Ephriam Knuckles, MD (Inactive) (General Surgery) Magrinat, Valentino Hue, MD (Inactive) (Hematology and Oncology) Marcellus Scott, DO as Consulting Physician (Optometry)  Indicate any recent Medical Services you may have received from other than [facility] providers in the past year (date may be approximate).     Assessment:   This is a routine wellness examination for Brent.  Hearing/Vision screen Vision Screening - Comments:: Wears rx glasses - up to date with routine eye exams with  [facility]    Goals Addressed             This Visit's Progress    Increase physical activity   On track    Starting 06/06/16, I will continue to walk on treadmill 30 min 4-5 days per week.        Depression Screen    02/04/2023   11:01 AM 12/26/2022   12:07 PM 01/23/2022   11:41 AM 01/02/2021   11:07 AM 12/06/2019    2:10 PM 12/02/2018    9:22 AM 07/03/2017    9:38 AM  PHQ 2/9 Scores  PHQ - 2 Score 0 0 0 0 0 0 0  PHQ- 9 Score 0 0 0  0  0    Fall Risk    02/04/2023   11:00 AM 01/29/2023    9:22 PM 12/26/2022   12:07 PM 01/23/2022   11:38 AM 01/02/2021   11:07 AM  Fall Risk   Falls in the past year? 0 0 0 0 0  Number falls in past yr: 0  0 0 0  Injury with Fall? 0  0 0 0  Risk for fall due to : No Fall Risks  No Fall Risks    Follow up Falls prevention discussed  Falls evaluation completed Falls evaluation completed;Education provided;Falls prevention discussed     MEDICARE RISK AT  HOME: Medicare Risk at Home Any stairs in or around the home?: Yes If so, are there any without handrails?: No Home free of loose throw rugs in walkways, pet beds, electrical cords, etc?: Yes Adequate lighting in your home to reduce risk of falls?: Yes Life alert?: No Use of a cane, walker or w/c?: No Grab bars in the bathroom?: Yes Shower chair or bench in shower?: Yes Elevated toilet seat or a handicapped toilet?: Yes  TIMED UP AND GO:  Was the test performed?  No    Cognitive Function:    12/06/2019    2:12 PM 07/03/2017    9:38 AM 06/06/2016    8:58 AM  MMSE - Mini Mental State Exam  Orientation to time 5 5 5   Orientation to Place 5 5 5   Registration 3 3 3   Attention/ Calculation 5 0 0  Recall 3 3 3   Language- name 2 objects  0 0  Language- repeat 1 1 1   Language- follow 3 step command  3 3  Language- read & follow direction  0 0  Write a sentence  0 0  Copy design  0 0  Total score  20 20        02/04/2023   11:02 AM 01/23/2022   11:34 AM  6CIT Screen  What Year? 0 points 0 points  What month? 0 points 0 points  What time? 0 points 0 points  Count back from 20 0 points 0 points  Months in reverse 0 points 0 points  Repeat phrase 0 points 0 points  Total Score 0 points 0 points    Immunizations Immunization History  Administered Date(s) Administered   Influenza,inj,Quad PF,6+ Mos 12/02/2018   PFIZER(Purple Top)SARS-COV-2 Vaccination 06/06/2019   Pneumococcal Conjugate-13 06/01/2015   Pneumococcal Polysaccharide-23 05/29/2014   Td 01/27/2003, 11/17/2012    TDAP status: Due, Education has been provided regarding the importance of this vaccine. Advised may receive this vaccine at local pharmacy or Health Dept. Aware to provide a copy of the vaccination record if obtained from local pharmacy or Health Dept. Verbalized acceptance and understanding.  Flu Vaccine status: Due, Education has been provided regarding the importance of this vaccine. Advised may  receive this vaccine at local pharmacy or Health Dept. Aware to provide a copy of the vaccination record if obtained from local pharmacy or Health Dept. Verbalized acceptance and understanding.  Pneumococcal vaccine status: Up to date  Covid-19 vaccine status: Completed vaccines  Qualifies for Shingles Vaccine? Yes   Zostavax completed No   Shingrix Completed?: No.    Education has been provided regarding the importance of this vaccine. Patient has been advised to call insurance company to determine out of pocket expense if they have not yet received this vaccine. Advised may also receive vaccine at local pharmacy or Health Dept. Verbalized acceptance and understanding.  Screening Tests Health Maintenance  Topic Date Due   Zoster Vaccines- Shingrix (1 of 2) Never done   Cervical Cancer Screening (Pap smear)  11/18/2014   DTaP/Tdap/Td (3 - Tdap) 11/18/2022   INFLUENZA VACCINE  07/06/2023 (Originally 11/06/2022)   COVID-19 Vaccine (2 - Pfizer risk series) 01/11/2028 (Originally 06/27/2019)   MAMMOGRAM  07/24/2023   Medicare Annual Wellness (AWV)  02/04/2024   Colonoscopy  08/07/2025   Pneumonia Vaccine 32+ Years old  Completed   DEXA SCAN  Completed   Hepatitis C Screening  Completed   HPV VACCINES  Aged Out    Health Maintenance  Health Maintenance Due  Topic Date Due   Zoster Vaccines- Shingrix (1 of 2) Never done   Cervical Cancer Screening (Pap smear)  11/18/2014   DTaP/Tdap/Td (3 - Tdap) 11/18/2022    Colorectal cancer screening: No longer required.   Mammogram status: No longer required due to age.  Bone Density status: Ordered scheduled 08/11/2023. Pt provided with contact info and advised to call to schedule appt.  Lung Cancer Screening: (Low Dose CT Chest recommended if Age 35-80 years, 20 pack-year currently smoking OR have quit w/in 15years.) does not qualify.   Lung Cancer Screening Referral: n/a  Additional Screening:  Hepatitis C Screening: does not qualify;  Completed 06/06/2016  Vision Screening: Recommended annual ophthalmology exams for early detection of glaucoma and other disorders of the eye. Is the patient up to date with their annual eye exam?  Yes  Who is the provider or what is the name of the office in which the patient attends annual eye exams? [facility]  If pt is not established with a provider, would they like to be referred to a provider to establish care? No .   Dental Screening: Recommended annual dental exams for proper oral hygiene   Community Resource Referral / Chronic Care Management: CRR required this visit?  No   CCM required this visit?  No     Plan:     I have personally reviewed and noted the following in the patient's chart:   Medical and social history Use of alcohol, tobacco or illicit drugs  Current medications and supplements  including opioid prescriptions. Patient is not currently taking opioid prescriptions. Functional ability and status Nutritional status Physical activity Advanced directives List of other physicians Hospitalizations, surgeries, and ER visits in previous 12 months Vitals Screenings to include cognitive, depression, and falls Referrals and appointments  In addition, I have reviewed and discussed with patient certain preventive protocols, quality metrics, and best practice recommendations. A written personalized care plan for preventive services as well as general preventive health recommendations were provided to patient.     Lorrene Reid, LPN   [id number]   After Visit Summary: (MyChart) Due to this being a telephonic visit, the after visit summary with patients personalized plan was offered to patient via MyChart   Nurse Notes: none

## 2023-02-09 ENCOUNTER — Ambulatory Visit (INDEPENDENT_AMBULATORY_CARE_PROVIDER_SITE_OTHER): Payer: Medicare Other | Admitting: Family Medicine

## 2023-02-09 ENCOUNTER — Encounter: Payer: Self-pay | Admitting: Family Medicine

## 2023-02-09 VITALS — BP 104/56 | HR 73 | Temp 98.6°F | Ht 61.5 in | Wt 134.5 lb

## 2023-02-09 DIAGNOSIS — S81812A Laceration without foreign body, left lower leg, initial encounter: Secondary | ICD-10-CM | POA: Diagnosis not present

## 2023-02-09 DIAGNOSIS — E559 Vitamin D deficiency, unspecified: Secondary | ICD-10-CM

## 2023-02-09 DIAGNOSIS — E538 Deficiency of other specified B group vitamins: Secondary | ICD-10-CM | POA: Diagnosis not present

## 2023-02-09 DIAGNOSIS — I781 Nevus, non-neoplastic: Secondary | ICD-10-CM | POA: Diagnosis not present

## 2023-02-09 DIAGNOSIS — E2839 Other primary ovarian failure: Secondary | ICD-10-CM

## 2023-02-09 DIAGNOSIS — Z853 Personal history of malignant neoplasm of breast: Secondary | ICD-10-CM | POA: Diagnosis not present

## 2023-02-09 DIAGNOSIS — C44311 Basal cell carcinoma of skin of nose: Secondary | ICD-10-CM

## 2023-02-09 DIAGNOSIS — E7800 Pure hypercholesterolemia, unspecified: Secondary | ICD-10-CM | POA: Diagnosis not present

## 2023-02-09 DIAGNOSIS — G629 Polyneuropathy, unspecified: Secondary | ICD-10-CM | POA: Diagnosis not present

## 2023-02-09 DIAGNOSIS — M810 Age-related osteoporosis without current pathological fracture: Secondary | ICD-10-CM

## 2023-02-09 NOTE — Assessment & Plan Note (Signed)
Healed

## 2023-02-09 NOTE — Assessment & Plan Note (Signed)
Under care of derm Had moh's on nose Uses sun protection

## 2023-02-09 NOTE — Assessment & Plan Note (Signed)
Dexa ordered at [facility] Has osteoporosis

## 2023-02-09 NOTE — Assessment & Plan Note (Signed)
Lab Results  Component Value Date   VITAMINB12 440 02/02/2023   Continues current oral supplement

## 2023-02-09 NOTE — Assessment & Plan Note (Signed)
Dexa 12/2018  Referral done to schedule next one at [facility] No falls or fractures Discussed fall prevention, supplements and exercise for bone density  D level is ok

## 2023-02-09 NOTE — Assessment & Plan Note (Signed)
Pt may consider vein clinic in future  They are bothersome

## 2023-02-09 NOTE — Patient Instructions (Addendum)
If you are interested in the new shingles vaccine (Shingrix) - call your local pharmacy to check on coverage and availability   You also need a tetanus shot at the pharmacy    Call and schedule your dexa at [facility] can cancel the one at the breast center    Keep walking Add some strength training to your routine, this is important for bone and brain health and can reduce your risk of falls and help your body use insulin properly and regulate weight  Light weights, exercise bands , and internet videos are a good way to start  Yoga (chair or regular), machines , floor exercises or a gym with machines are also good options    I put the referral in for neurology to discuss and evaluate neuropathy in your feet  Please let us know if you don't hear in 1-2 weeks   A vein clinic is who to call to work on spider veins      You have an order for:  []   2D Mammogram  []   3D Mammogram  [x]   Bone Density     Please call for appointment:   []   [facility]  [address] [place] [address]  [phone]  []   [facility])   [address]  [place], [place] [address]  [phone]  []   [facility]      [address] [place], [place]        [phone]         []   [facility]  [address] [place], [place]  [phone]  [x]  [facility]   [address]   [place], [place] [address]  [phone]  []  [facility]  [address] [place], [place] [address] [phone]    Make sure to wear two piece clothing  No lotions powders or deodorants the day of the appointment Make sure to bring picture ID and insurance card.  Bring list of medications you are currently taking including any supplements.   Schedule your screening  mammogram through MyChart!   Select [facility] imaging sites can now be scheduled through MyChart.  Log into your MyChart account.  Go to 'Visit' (or 'Appointments' if  on mobile App) --> Schedule an  Appointment  Under 'Select a Reason for Visit' choose the Mammogram  Screening option.  Complete the pre-visit questions  and select the time and place that  best fits your schedule

## 2023-02-09 NOTE — Assessment & Plan Note (Signed)
Last vitamin D Lab Results  Component Value Date   VD25OH 44.28 02/02/2023   Vitamin D level is therapeutic with current supplementation Disc importance of this to bone and overall health

## 2023-02-09 NOTE — Assessment & Plan Note (Signed)
This is bothersome in feet  Referral made to neurology for further eval and treatment

## 2023-02-09 NOTE — Assessment & Plan Note (Signed)
Disc goals for lipids and reasons to control them Rev last labs with pt Rev low sat fat diet in detail Overall stable Ratio of 3

## 2023-02-09 NOTE — Progress Notes (Signed)
Subjective:    Patient ID: Tina Mendez, female    DOB: 05/31/1945, 77 y.o.   MRN: [id number]  HPI  Pt presents for annual follow up of chronic health problems   Wt Readings from Last 3 Encounters:  02/09/23 134 lb 8 oz (61 kg)  02/04/23 130 lb (59 kg)  12/26/22 133 lb (60.3 kg)   25.00 kg/m  Vitals:   02/09/23 0900  BP: (!) 104/56  Pulse: 73  Temp: 98.6 F (37 C)  SpO2: 99%    Immunization History  Administered Date(s) Administered   Influenza,inj,Quad PF,6+ Mos 12/02/2018   PFIZER(Purple Top)SARS-COV-2 Vaccination 06/06/2019   Pneumococcal Conjugate-13 06/01/2015   Pneumococcal Polysaccharide-23 05/29/2014   Td 01/27/2003, 11/17/2012    There are no preventive care reminders to display for this patient.  Feeling ok    Shingrix- interested /will check on at pharmacy   Flu shot -declines   Tetanus shot -needs to get at pharmacy    Mammogram 07/2022  Personal history of breast cancer  Self breast exam- nothing new   Gyn health No problems   Colon cancer screening  08/2020  5 y recall   Bone health  Dexa  12/2018 at [facility]  OSTEOPOROSIS  Was waiting on dental work to treat , had broken tooth taken care of  Has dentist visit every 4 months and deep cleaning twice yearly  Has one scheduled in 2025 Falls- none Fractures-none  Supplements  Last vitamin D Lab Results  Component Value Date   VD25OH 44.28 02/02/2023    Exercise  Walks almost every day 20-30 minutes  [facility]    History of basal cell ca recent -moh's procedure on nose  (Dr Adriana Simas)  Is good about sun protection     Had skin tear on leg in sept - healed   Spider veins- wears support socks in winter   Neuropathy is bothersome-wants to see neuro   Mood    02/04/2023   11:01 AM 12/26/2022   12:07 PM 01/23/2022   11:41 AM 01/02/2021   11:07 AM 12/06/2019    2:10 PM  Depression screen PHQ 2/9  Decreased Interest 0 0 0 0 0  Down, Depressed, Hopeless  0 0 0 0 0  PHQ - 2 Score 0 0 0 0 0  Altered sleeping 0 0 0  0  Tired, decreased energy 0 0 0  0  Change in appetite 0 0 0  0  Feeling bad or failure about yourself  0 0 0  0  Trouble concentrating 0 0 0  0  Moving slowly or fidgety/restless 0 0   0  Suicidal thoughts 0 0 0  0  PHQ-9 Score 0 0 0  0  Difficult doing work/chores Not difficult at all Not difficult at all Not difficult at all  Not difficult at all   Hyperlipidemia Lab Results  Component Value Date   CHOL 219 (H) 02/02/2023   CHOL 224 (H) 01/24/2022   CHOL 224 (H) 12/12/2020   Lab Results  Component Value Date   HDL 74.50 02/02/2023   HDL 79.10 01/24/2022   HDL 68.40 12/12/2020   Lab Results  Component Value Date   LDLCALC 127 (H) 02/02/2023   LDLCALC 134 (H) 01/24/2022   LDLCALC 140 (H) 12/12/2020   Lab Results  Component Value Date   TRIG 88.0 02/02/2023   TRIG 54.0 01/24/2022   TRIG 76.0 12/12/2020   Lab Results  Component Value Date   CHOLHDL 3 02/02/2023   CHOLHDL 3 01/24/2022   CHOLHDL 3 12/12/2020   Lab Results  Component Value Date   LDLDIRECT 145.7 05/20/2013   LDLDIRECT 153.9 11/11/2012   LDLDIRECT 152.9 09/29/2008   Does eat some beef occasionally  Salmon  Chicken   No fast food   Vit B12 def Lab Results  Component Value Date   VITAMINB12 440 02/02/2023   Oral supplementation   Lab Results  Component Value Date   NA 141 02/02/2023   K 4.8 02/02/2023   CO2 29 02/02/2023   GLUCOSE 97 02/02/2023   BUN 13 02/02/2023   CREATININE 0.76 02/02/2023   CALCIUM 9.7 02/02/2023   GFR 76.14 02/02/2023   GFRNONAA 90.19 06/23/2008   Lab Results  Component Value Date   ALT 12 02/02/2023   AST 18 02/02/2023   ALKPHOS 64 02/02/2023   BILITOT 1.0 02/02/2023   Lab Results  Component Value Date   TSH 1.77 02/02/2023   Lab Results  Component Value Date   WBC 4.5 02/02/2023   HGB 13.9 02/02/2023   HCT 42.1 02/02/2023   MCV 96.7 02/02/2023   PLT 181.0 02/02/2023     Patient  Active Problem List   Diagnosis Date Noted   Spider veins 02/09/2023   Neuropathy 12/07/2019   B12 deficiency 12/07/2019   Basal cell carcinoma 11/13/2016   Estrogen deficiency 06/06/2016   Routine general medical examination at a health care facility 05/24/2015   Vitamin D deficiency 05/29/2014   Pure hypercholesterolemia 06/23/2008   Osteoporosis 06/16/2008   hx: breast cancer, left IDC, 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 + 06/16/2008   Past Medical History:  Diagnosis Date   Abdominal pain 05/2018   right lower abd pain/one time   Allergy    pollen   Basal cell carcinoma of skin    mainly on face   Cancer (HCC) 2002   L BREAST/had mastectomy   Colon polyp    Hemorrhoids    hx of    History of anal fissures    Osteoporosis    Plantar fasciitis    Past Surgical History:  Procedure Laterality Date   Basal Cell Lesion  2018   removed from nose/ [facility]   Breast CA signs - Chemo  06/2000   no radiation   BREAST CYST EXCISION  1970   rt   CESAREAN SECTION  1980   1 time   COLONOSCOPY  2014   MASTECTOMY  2002   LEFT   Social History   Tobacco Use   Smoking status: Never   Smokeless tobacco: Never  Vaping Use   Vaping status: Never Used  Substance Use Topics   Alcohol use: No   Drug use: No   Family History  Problem Relation Age of Onset   Diabetes Mother    Heart disease Mother    Heart failure Mother    Varicose Veins Mother    Colon polyps Father    Cancer Paternal Aunt        breast   Cancer Paternal Uncle        Lung CA, smoker   Colon cancer Neg Hx    Breast cancer Neg Hx    Esophageal cancer Neg Hx    Rectal cancer Neg Hx    Stomach cancer Neg Hx    Allergies  Allergen Reactions   Sulfonamide Derivatives Nausea Only   Current Outpatient Medications on File Prior to Visit  Medication  Sig Dispense Refill   Calcium Carb-Cholecalciferol (CALCIUM + VITAMIN D3) 600-5 MG-MCG TABS Take 1 tablet by mouth daily.     Cholecalciferol (VITAMIN D3) 1000 units  CAPS daily. Take one pill daily     cyanocobalamin (VITAMIN B12) 500 MCG tablet Take 500 mcg by mouth daily.     No current facility-administered medications on file prior to visit.    Review of Systems  Constitutional:  Negative for activity change, appetite change, fatigue, fever and unexpected weight change.  HENT:  Negative for congestion, ear pain, rhinorrhea, sinus pressure and sore throat.   Eyes:  Negative for pain, redness and visual disturbance.  Respiratory:  Negative for cough, shortness of breath and wheezing.   Cardiovascular:  Negative for chest pain and palpitations.  Gastrointestinal:  Negative for abdominal pain, blood in stool, constipation and diarrhea.  Endocrine: Negative for polydipsia and polyuria.  Genitourinary:  Negative for dysuria, frequency and urgency.  Musculoskeletal:  Positive for arthralgias. Negative for back pain and myalgias.       Spider veins in legs are bothersome  Skin:  Negative for pallor and rash.       Skin tear on leg is healed  Allergic/Immunologic: Negative for environmental allergies.  Neurological:  Negative for dizziness, syncope and headaches.       Burning in feet-? Neuropathy    Hematological:  Negative for adenopathy. Does not bruise/bleed easily.  Psychiatric/Behavioral:  Negative for decreased concentration and dysphoric mood. The patient is not nervous/anxious.        Objective:   Physical Exam Constitutional:      General: She is not in acute distress.    Appearance: Normal appearance. She is well-developed and normal weight. She is not ill-appearing or diaphoretic.  HENT:     Head: Normocephalic and atraumatic.     Right Ear: Tympanic membrane, ear canal and external ear normal.     Left Ear: Tympanic membrane, ear canal and external ear normal.     Nose: Nose normal. No congestion.     Mouth/Throat:     Mouth: Mucous membranes are moist.     Pharynx: Oropharynx is clear. No posterior oropharyngeal erythema.  Eyes:      General: No scleral icterus.    Extraocular Movements: Extraocular movements intact.     Conjunctiva/sclera: Conjunctivae normal.     Pupils: Pupils are equal, round, and reactive to light.  Neck:     Thyroid: No thyromegaly.     Vascular: No carotid bruit or JVD.  Cardiovascular:     Rate and Rhythm: Normal rate and regular rhythm.     Pulses: Normal pulses.     Heart sounds: Normal heart sounds.     No gallop.  Pulmonary:     Effort: Pulmonary effort is normal. No respiratory distress.     Breath sounds: Normal breath sounds. No wheezing.     Comments: Good air exch Chest:     Chest wall: No tenderness.  Abdominal:     General: Bowel sounds are normal. There is no distension or abdominal bruit.     Palpations: Abdomen is soft. There is no mass.     Tenderness: There is no abdominal tenderness.     Hernia: No hernia is present.  Genitourinary:    Comments: Breast exam: No mass, nodules, thickening, tenderness, bulging, retraction, inflamation, nipple discharge or skin changes noted.  No axillary or clavicular LA.     Musculoskeletal:        General: No  tenderness. Normal range of motion.     Cervical back: Normal range of motion and neck supple. No rigidity. No muscular tenderness.     Right lower leg: No edema.     Left lower leg: No edema.     Comments: Mild kyphosis    Spider veins noted on LEs    Lymphadenopathy:     Cervical: No cervical adenopathy.  Skin:    General: Skin is warm and dry.     Coloration: Skin is not pale.     Findings: No erythema or rash.     Comments: Left LE skin tear is healed  Solar lentigines diffusely   Neurological:     Mental Status: She is alert. Mental status is at baseline.     Cranial Nerves: No cranial nerve deficit.     Motor: No abnormal muscle tone.     Coordination: Coordination normal.     Gait: Gait normal.     Deep Tendon Reflexes: Reflexes are normal and symmetric. Reflexes normal.  Psychiatric:        Mood and  Affect: Mood normal.        Cognition and Memory: Cognition and memory normal.           Assessment & Plan:   Problem List Items Addressed This Visit       Cardiovascular and Mediastinum   Spider veins    Pt may consider vein clinic in future  They are bothersome        Nervous and Auditory   Neuropathy    This is bothersome in feet  Referral made to neurology for further eval and treatment       Relevant Orders   Ambulatory referral to Neurology     Musculoskeletal and Integument   Basal cell carcinoma    Under care of derm Had moh's on nose Uses sun protection       Osteoporosis - Primary    Dexa 12/2018  Referral done to schedule next one at [facility] No falls or fractures Discussed fall prevention, supplements and exercise for bone density  D level is ok       Relevant Orders   DG Bone Density   RESOLVED: Skin tear of left lower leg without complication    Healed         Other   B12 deficiency    Lab Results  Component Value Date   VITAMINB12 440 02/02/2023   Continues current oral supplement      Estrogen deficiency    Dexa ordered at [facility] Has osteoporosis       hx: breast cancer, left IDC, 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

## 2023-02-09 NOTE — Assessment & Plan Note (Signed)
Doing well  Normal exam Mammo utd 07/2022

## 2023-04-21 ENCOUNTER — Inpatient Hospital Stay: Admission: RE | Admit: 2023-04-21 | Payer: Medicare Other | Source: Ambulatory Visit

## 2023-05-12 ENCOUNTER — Inpatient Hospital Stay: Admission: RE | Admit: 2023-05-12 | Payer: Medicare Other | Source: Ambulatory Visit

## 2023-05-26 ENCOUNTER — Ambulatory Visit (INDEPENDENT_AMBULATORY_CARE_PROVIDER_SITE_OTHER)
Admission: RE | Admit: 2023-05-26 | Discharge: 2023-05-26 | Disposition: A | Discharge status: Home / Self Care | Payer: Medicare Other | Source: Ambulatory Visit | Attending: Family Medicine | Admitting: Family Medicine

## 2023-05-26 DIAGNOSIS — M810 Age-related osteoporosis without current pathological fracture: Secondary | ICD-10-CM

## 2023-05-27 ENCOUNTER — Encounter: Payer: Self-pay | Admitting: Family Medicine

## 2023-06-15 ENCOUNTER — Encounter: Payer: Self-pay | Admitting: Family Medicine

## 2023-06-15 ENCOUNTER — Ambulatory Visit (INDEPENDENT_AMBULATORY_CARE_PROVIDER_SITE_OTHER): Admitting: Family Medicine

## 2023-06-15 VITALS — BP 115/62 | HR 74 | Temp 98.3°F | Ht 61.5 in | Wt 135.2 lb

## 2023-06-15 DIAGNOSIS — Z23 Encounter for immunization: Secondary | ICD-10-CM | POA: Insufficient documentation

## 2023-06-15 DIAGNOSIS — L989 Disorder of the skin and subcutaneous tissue, unspecified: Secondary | ICD-10-CM | POA: Diagnosis not present

## 2023-06-15 DIAGNOSIS — G629 Polyneuropathy, unspecified: Secondary | ICD-10-CM

## 2023-06-15 DIAGNOSIS — M810 Age-related osteoporosis without current pathological fracture: Secondary | ICD-10-CM | POA: Diagnosis not present

## 2023-06-15 MED ORDER — ALENDRONATE SODIUM 70 MG PO TABS: 70.0000 mg | ORAL_TABLET | ORAL | 3 refills | Status: DC

## 2023-06-15 MED ORDER — TETANUS-DIPHTHERIA TOXOIDS TD 5-2 LFU IM INJ: 0.5000 mL | INJECTION | Freq: Once | INTRAMUSCULAR | 0 refills | Status: AC

## 2023-06-15 NOTE — Assessment & Plan Note (Addendum)
 Has appointment with neuro/ Dr Lucia Gaskins upcoming Bothersome in feet No clinical changes

## 2023-06-15 NOTE — Assessment & Plan Note (Signed)
 Reviewed cexa from dec 2024 TS -3.1 in spine  Pt talked to dentist and periodontist -no big work upcoming  They disagreed re: how long to hold med for procedures if needed in future On ca and D No falls or fracture Fam history of osteoporosis   Discussed fall prevention, supplements and exercise for bone density  On ca and D  Strongly encouraged muscle building exercise for bone health and balance   Will try alendronate weekly  Reviewed proper way to take Reviewed possible side effects Handout given   If tolerated -will take for 5 y

## 2023-06-15 NOTE — Patient Instructions (Addendum)
 For osteoporosis  Take alendronate once per week  Take it correctly to avoid heartburn  If any side effects hold it and let us know   Keep walking  Add some strength training to your routine, this is important for bone and brain health and can reduce your risk of falls and help your body use insulin properly and regulate weight  Light weights, exercise bands , and internet videos are a good way to start  Yoga (chair or regular), machines , floor exercises or a gym with machines are also good options     I sent in prescription for tetanus shot   Call your dermatologist to check on the scab on your right leg [name]

## 2023-06-15 NOTE — Assessment & Plan Note (Signed)
 Sent prescription for this vaccine to her pharmacy so she can get it updated

## 2023-06-15 NOTE — Progress Notes (Signed)
 Subjective:    Patient ID: Tina Mendez, female    DOB: [birth year], 78 y.o.   MRN: [id number]  HPI  Wt Readings from Last 3 Encounters:  06/15/23 135 lb 4 oz (61.3 kg)  02/09/23 134 lb 8 oz (61 kg)  02/04/23 130 lb (59 kg)   25.14 kg/m  Vitals:   06/15/23 1016 06/15/23 1040  BP: (!) 146/78 115/62  Pulse: 74   Temp: 98.3 F (36.8 C)   SpO2: 100%     Pt presents with concern of area on leg  Also discuss osteoporosis medication  Needs prescription sent for tetanus shot to pharmacy (last 11/2012 -was Td)   Spot on right lower leg near ankle  Gets a scab / then falls off and does not heal and happens again  Bleeding this am  Used a band aid (it got wet)   Sees derm at [facility]  Next appointment is in June    Periodontist was ok with osteoporosis medication  Has some questions   Dexa 03/2024  TS -3.1 in spine   down 3.6%  Takes ca and D No falls or fractures    Has appointment with Dr Lucia Gaskins for neuropathy Azzie Almas     Patient Active Problem List   Diagnosis Date Noted   Leg skin lesion, right 06/15/2023   Need for prophylactic vaccination with tetanus-diphtheria (Td) 06/15/2023   Spider veins 02/09/2023   Neuropathy 12/07/2019   B12 deficiency 12/07/2019   Basal cell carcinoma 11/13/2016   Estrogen deficiency 06/06/2016   Routine general medical examination at a health care facility 05/24/2015   Vitamin D deficiency 05/29/2014   Pure hypercholesterolemia 06/23/2008   Osteoporosis 06/16/2008   hx: breast cancer, left IDC, 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 + 06/16/2008   Past Medical History:  Diagnosis Date   Abdominal pain 05/2018   right lower abd pain/one time   Allergy    pollen   Basal cell carcinoma of skin    mainly on face   Cancer (HCC) 2002   L BREAST/had mastectomy   Colon polyp    Hemorrhoids    hx of    History of anal fissures    Osteoporosis    Plantar fasciitis    Past Surgical History:  Procedure Laterality Date   Basal Cell Lesion  2018    removed from nose/ at [facility]   Breast CA signs - Chemo  06/2000   no radiation   BREAST CYST EXCISION  1970   rt   CESAREAN SECTION  1980   1 time   COLONOSCOPY  2014   MASTECTOMY  2002   LEFT   Social History   Tobacco Use   Smoking status: Never   Smokeless tobacco: Never  Vaping Use   Vaping status: Never Used  Substance Use Topics   Alcohol use: No   Drug use: No   Family History  Problem Relation Age of Onset   Diabetes Mother    Heart disease Mother    Heart failure Mother    Varicose Veins Mother    Colon polyps Father    Cancer Paternal Aunt        breast   Cancer Paternal Uncle        Lung CA, smoker   Colon cancer Neg Hx    Breast cancer Neg Hx    Esophageal cancer Neg Hx    Rectal cancer Neg Hx    Stomach cancer Neg Hx    Allergies  Allergen Reactions   Sulfonamide Derivatives Nausea Only   Current Outpatient Medications on File Prior to Visit  Medication Sig Dispense Refill   Calcium-Magnesium-Vitamin D (CALCIUM 1200+D3 PO) Take 1 capsule by mouth daily.     Cholecalciferol (VITAMIN D3) 1000 units CAPS daily. Take one pill daily     cyanocobalamin (VITAMIN B12) 500 MCG tablet Take 500 mcg by mouth daily.     No current facility-administered medications on file prior to visit.    Review of Systems  Constitutional:  Negative for activity change, appetite change, fatigue, fever and unexpected weight change.  HENT:  Negative for congestion, ear pain, rhinorrhea, sinus pressure and sore throat.   Eyes:  Negative for pain, redness and visual disturbance.  Respiratory:  Negative for cough, shortness of breath and wheezing.   Cardiovascular:  Negative for chest pain and palpitations.  Gastrointestinal:  Negative for abdominal pain, blood in stool, constipation and diarrhea.  Endocrine: Negative for polydipsia and polyuria.  Genitourinary:  Negative for dysuria, frequency and urgency.  Musculoskeletal:  Negative for arthralgias, back pain and  myalgias.  Skin:  Positive for wound. Negative for pallor and rash.  Allergic/Immunologic: Negative for environmental allergies.  Neurological:  Negative for dizziness, syncope and headaches.  Hematological:  Negative for adenopathy. Does not bruise/bleed easily.  Psychiatric/Behavioral:  Negative for decreased concentration and dysphoric mood. The patient is nervous/anxious.        Objective:   Physical Exam Constitutional:      General: She is not in acute distress.    Appearance: Normal appearance. She is well-developed and normal weight. She is not ill-appearing or diaphoretic.  HENT:     Head: Normocephalic and atraumatic.  Eyes:     Conjunctiva/sclera: Conjunctivae normal.     Pupils: Pupils are equal, round, and reactive to light.  Neck:     Thyroid: No thyromegaly.     Vascular: No carotid bruit or JVD.  Cardiovascular:     Rate and Rhythm: Normal rate and regular rhythm.     Heart sounds: Normal heart sounds.     No gallop.  Pulmonary:     Effort: Pulmonary effort is normal. No respiratory distress.     Breath sounds: Normal breath sounds. No wheezing or rales.  Abdominal:     General: There is no distension or abdominal bruit.     Palpations: Abdomen is soft.  Musculoskeletal:     Cervical back: Normal range of motion and neck supple.     Right lower leg: No edema.     Left lower leg: No edema.     Comments: No kyphosis  Fairly petite frame  Lymphadenopathy:     Cervical: No cervical adenopathy.  Skin:    General: Skin is warm and dry.     Coloration: Skin is not pale.     Findings: No rash.     Comments: Solar lentigines diffusely Solar aging   Some sks   Small (0.5 cm) scab on right lateral lower leg near ankle  Looks like abraded scab that recently bled  Some dermatitis from bandaid surrounding it where the tape was       Neurological:     Mental Status: She is alert.     Coordination: Coordination normal.     Deep Tendon Reflexes: Reflexes are  normal and symmetric. Reflexes normal.  Psychiatric:        Mood and Affect: Mood normal.           Assessment &  Plan:   Problem List Items Addressed This Visit       Nervous and Auditory   Neuropathy   Has appointment with neuro/ Dr Lucia Gaskins upcoming Bothersome in feet No clinical changes         Musculoskeletal and Integument   Osteoporosis - Primary   Reviewed cexa from dec 2024 TS -3.1 in spine  Pt talked to dentist and periodontist -no big work upcoming  They disagreed re: how long to hold med for procedures if needed in future On ca and D No falls or fracture Fam history of osteoporosis   Discussed fall prevention, supplements and exercise for bone density  On ca and D  Strongly encouraged muscle building exercise for bone health and balance   Will try alendronate weekly  Reviewed proper way to take Reviewed possible side effects Handout given   If tolerated -will take for 5 y       Relevant Medications   Calcium-Magnesium-Vitamin D (CALCIUM 1200+D3 PO)   alendronate (FOSAMAX) 70 MG tablet   Leg skin lesion, right   Small scab near lateral  ankle- continues to come off /then not heal  Today was bleeding a bit  Encouraged to keep clean and dry  Follow up with dermatology for exam and likely removal  History of BCC in past           Other   Need for prophylactic vaccination with tetanus-diphtheria (Td)   Sent prescription for this vaccine to her pharmacy so she can get it updated

## 2023-06-15 NOTE — Assessment & Plan Note (Signed)
 Small scab near lateral  ankle- continues to come off /then not heal  Today was bleeding a bit  Encouraged to keep clean and dry  Follow up with dermatology for exam and likely removal  History of BCC in past

## 2023-06-17 ENCOUNTER — Telehealth: Payer: Self-pay

## 2023-06-17 ENCOUNTER — Encounter: Payer: Self-pay | Admitting: Neurology

## 2023-06-17 ENCOUNTER — Ambulatory Visit (INDEPENDENT_AMBULATORY_CARE_PROVIDER_SITE_OTHER): Payer: Federal, State, Local not specified - PPO | Admitting: Neurology

## 2023-06-17 VITALS — BP 128/65 | HR 71 | Ht 61.5 in | Wt 134.0 lb

## 2023-06-17 DIAGNOSIS — G629 Polyneuropathy, unspecified: Secondary | ICD-10-CM

## 2023-06-17 DIAGNOSIS — E519 Thiamine deficiency, unspecified: Secondary | ICD-10-CM | POA: Diagnosis not present

## 2023-06-17 DIAGNOSIS — R7309 Other abnormal glucose: Secondary | ICD-10-CM

## 2023-06-17 DIAGNOSIS — E538 Deficiency of other specified B group vitamins: Secondary | ICD-10-CM | POA: Diagnosis not present

## 2023-06-17 DIAGNOSIS — E531 Pyridoxine deficiency: Secondary | ICD-10-CM

## 2023-06-17 MED ORDER — TETANUS-DIPHTH-ACELL PERTUSSIS 5-2.5-18.5 LF-MCG/0.5 IM SUSP: 0.5000 mL | Freq: Once | INTRAMUSCULAR | 0 refills | Status: AC

## 2023-06-17 NOTE — Addendum Note (Signed)
 Addended by: Roxy Manns A on: 06/17/2023 04:31 PM   Modules accepted: Orders

## 2023-06-17 NOTE — Telephone Encounter (Signed)
 Spoke with pharmacy and they only have Boostrix, they said they can give pt that vaccine if PCP sends a new Rx in

## 2023-06-17 NOTE — Patient Instructions (Addendum)
  Peripheral Neuropathy Peripheral neuropathy is a type of nerve damage. It affects nerves that carry signals between the spinal cord and the arms, legs, and the rest of the body (peripheral nerves). It does not affect nerves in the spinal cord or brain. In peripheral neuropathy, one nerve or a group of nerves may be damaged. Peripheral neuropathy is a broad category that includes many specific nerve disorders, like diabetic neuropathy, hereditary neuropathy, and carpal tunnel syndrome. What are the causes? This condition may be caused by: Certain diseases, such as: Diabetes. This is the most common cause of peripheral neuropathy. Autoimmune diseases, such as rheumatoid arthritis and systemic lupus erythematosus. Nerve diseases that are passed from parent to child (inherited). Kidney disease. Thyroid disease. Other causes may include: Nerve injury. Pressure or stress on a nerve that lasts a long time. Lack (deficiency) of B vitamins. This can result from alcoholism, poor diet, or a restricted diet. Infections. Some medicines, such as cancer medicines (chemotherapy). Poisonous (toxic) substances, such as lead and mercury, alcohol Too little blood flowing to the legs. Obesity In some cases, the cause of this condition is not known. What are the signs or symptoms? Symptoms of this condition depend on which of your nerves is damaged. Symptoms in the legs, hands, and arms can include: Loss of feeling (numbness) in the feet, hands, or both. Tingling in the feet, hands, or both. Burning pain. Very sensitive skin. Weakness. Not being able to move a part of the body (paralysis). Clumsiness or poor coordination. Muscle twitching. Loss of balance. Symptoms in other parts of the body can include: Not being able to control your bladder. Feeling dizzy. Sexual problems.   Reflux Varicose Veins  Reflux varicose veins are enlarged, twisted veins that develop when valves in the veins become  incompetent, allowing blood to flow backward and pool in the legs.  Causes:  Weakened or damaged valves in the veins Increased pressure in the veins due to factors such as pregnancy, obesity, or prolonged standing or sitting Family history Hormonal changes  Symptoms:  Bulging, twisted veins that may be blue, purple, or red Pain, heaviness, or cramping in the legs Swelling in the legs, especially at the end of the day Skin discoloration or ulcers (in severe cases)  Diagnosis.

## 2023-06-17 NOTE — Progress Notes (Unsigned)
 [facility]    Provider:  Dr Lucia Gaskins Requesting Provider: Milinda Antis, Audrie Gallus, MD Primary Care Provider:  Judy Pimple, MD  CC:  tingling in the balls of the feet  HPI:  Tina Mendez is a 78 y.o. female here as requested by Judy Pimple, MD for neuropathy. 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  She has a history of B12 deficiency.  Tina Mendez 78 year old. She has tingling in the feet. Compression socks help. The bottom of her feet have the tingling but normal exam per patient. Her mother was a diabetic but did not have neuropathy, no family history. This started several years ago and Dr. Milinda Antis found B12 194, B12 deficiency, he took injections and then oral medications and her B12 has been fine since then. Symptoms have been stable since B12 supplementation no worsening but did not improve. She doesn't think she has balance problems occ when she turns she feels off balance but not often. Mostly on the balls of the feet, the compression socks help continuous no worsening at night. Sometimes at night when not wearing the socks she gets cramps. No weakness. No other numbness or tingling, she may have some arthritic changes as it hurts to bend the fingers, no low back pain with radicular symptoms, no falls. She had chemotherapy in 2002 but does not fit the time course.   Reviewed notes, labs and imaging from outside physicians, which showed:  Labs reviewed February 02, 2023: TSH normal, CMP normal, CBC normal, vitamin D normal, had B12 deficiency in the past 3 years ago was 194 most recently October 2024 was 440.  In March 2018 hepatitis C  antibody was negative.  CT head w/wo 05/2002: FINDINGS  CLINICAL DATA:  F/U BREAST CANCER.  PREVIOUS MASTECTOMY AS WELL AS CHEMOTHERAPY & RADIATION THERAPY.  TECHNIQUE:  HEAD CT WAS PERFORMED BOTH BEFORE AND AFTER INTRAVENOUS CONTRAST ADMINISTRATION.  MULTIDETECTOR HELICAL CT OF THE CHEST, ABDOMEN AND PELVIS WAS PERFORMED DURING INTRAVENOUS  ADMINISTRATION OF 150 CC OMNIPAQUE 300 INTRAVENOUS CONTRAST.  ORAL CONTRAST WAS ALSO ADMINISTERED.  COMPARISON IS MADE WITH A PREVIOUS ABDOMEN AND PELVIS CT DATED 06/18/00.  HEAD CT W/O & W/IV CONTRAST  THERE IS NO EVIDENCE OF INTRACRANIAL HEMORRHAGE OR ABNORMAL MASS EFFECT.  THERE IS NO EVIDENCE OF  INTRACRANIAL MASS OR OTHER ABNORMAL AREAS OF CONTRAST ENHANCEMENT.  THERE IS NO EVIDENCE OF BRAIN  EDEMA.  NO OTHER INTRA-AXIAL ABNORMALITIES ARE SEEN.  THERE IS NO EVIDENCE OF EXTRA-AXIAL ABNORMALITY.  THE VENTRICLES ARE NORMAL IN SIZE.  BONE WINDOWS  ARE UNREMARKABLE.  IMPRESSION  NEGATIVE HEAD CT - NO EVIDENCE OF METASTATIC DISEASE.   I reviewed skin pathology report from May 2023 [facility]: She had basal cell carcinoma, this was not done to evaluate small fiber neuropathy.  Review of Systems: Patient complains of symptoms per HPI as well as the following symptoms none. Pertinent negatives and positives per HPI. All others negative.   Social History   Socioeconomic History   Marital status: Married    Spouse name: Not on file   Number of children: Not on file   Years of education: Not on file   Highest education level: Bachelor's degree (e.g., BA, AB,  BS)  Occupational History   Occupation: [employer]    Employer: [employer]  Tobacco Use   Smoking status: Never   Smokeless tobacco: Never  Vaping Use   Vaping status: Never Used  Substance and Sexual Activity   Alcohol use: No   Drug use: No   Sexual activity: Yes  Other Topics Concern   Not on file  Social History Narrative   Regular exercise:  walks daily,  does yard work/gardening   Left handed    Lives at home with husband    Caffeine: 1 cup in the morning   Social Drivers of Health   Financial Resource Strain: Low Risk  (06/13/2023)   Overall Financial Resource Strain (CARDIA)    Difficulty of Paying Living Expenses: Not hard at all  Food Insecurity: No Food Insecurity (06/13/2023)   Hunger Vital Sign    Worried About Running Out of Food in the Last Year: Never true    Ran Out of Food in the Last Year: Never true  Transportation Needs: No Transportation Needs (06/13/2023)   PRAPARE - Administrator, Civil Service (Medical): No    Lack of Transportation (Non-Medical): No  Physical Activity: Sufficiently Active (06/13/2023)   Exercise Vital Sign    Days of Exercise per Week: 5 days    Minutes of Exercise per Session: 30 min  Stress: No Stress Concern Present (06/13/2023)   [employer] of Occupational Health - Occupational Stress Questionnaire    Feeling of Stress : Not at all  Social Connections: Socially Integrated (06/13/2023)   Social Connection and Isolation Panel [NHANES]    Frequency of Communication with Friends and Family: Three times a week    Frequency of Social Gatherings with Friends and Family: Three times a week    Attends Religious Services: More than 4 times per year    Active Member of Clubs or Organizations: Yes    Attends Banker Meetings: More than 4 times per year    Marital Status: Married  Catering manager Violence: Not At Risk (02/04/2023)   Humiliation, Afraid, Rape, and Kick questionnaire    Fear of Current or Ex-Partner: No    Emotionally Abused: No    Physically Abused: No    Sexually Abused: No    Family History  Problem Relation Age of Onset   Diabetes Mother    Heart disease Mother    Heart failure Mother    Varicose Veins Mother    Colon polyps Father    Cancer Paternal Aunt        breast   Cancer Paternal Uncle        Lung CA, smoker   Colon cancer Neg Hx    Breast  cancer Neg Hx    Esophageal cancer Neg Hx    Rectal cancer Neg Hx    Stomach cancer Neg Hx    Neuropathy Neg Hx    Neural tube defect Neg Hx    Neurologic Disorder Neg Hx     Past Medical History:  Diagnosis Date   Abdominal pain 05/2018   right lower abd pain/one time   Allergy    pollen   Basal cell carcinoma of skin    mainly on face   Cancer (HCC) 2002   L BREAST/had mastectomy   Colon polyp    Hemorrhoids    hx of    History of anal fissures    Osteoporosis    Plantar fasciitis  Patient Active Problem List   Diagnosis Date Noted   Leg skin lesion, right 06/15/2023   Need for prophylactic vaccination with tetanus-diphtheria (Td) 06/15/2023   Spider veins 02/09/2023   Small fiber neuropathy 12/07/2019   B12 deficiency 12/07/2019   Basal cell carcinoma 11/13/2016   Estrogen deficiency 06/06/2016   Routine general medical examination at a health care facility 05/24/2015   Vitamin D deficiency 05/29/2014   Pure hypercholesterolemia 06/23/2008   Osteoporosis 06/16/2008   hx: breast cancer, left IDC, 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 Take with a full glass of water on an empty stomach. 12 tablet 3   Calcium-Magnesium-Vitamin D (CALCIUM 1200+D3 PO) Take 1 capsule by mouth daily. Vitamin D3 800 IU     cyanocobalamin (VITAMIN B12) 500 MCG tablet Take 500 mcg by mouth daily.     No current facility-administered medications for this visit.    Allergies as of 06/17/2023 - Review Complete  06/17/2023  Allergen Reaction Noted   Sulfonamide derivatives Nausea Only     Vitals: BP 128/65 (BP Location: Right Arm, Patient Position: Sitting, Cuff Size: Normal)   Pulse 71   Ht 5' 1.5" (1.562 m) Comment: pt reported  Wt 134 lb (60.8 kg)   BMI 24.91 kg/m  Last Weight:  Wt Readings from Last 1 Encounters:  06/17/23 134 lb (60.8 kg)   Last Height:   Ht Readings from Last 1 Encounters:  06/17/23 5' 1.5" (1.562 m)     Physical exam: Exam: Gen: NAD, conversant, well nourised, well groomed                     CV: RRR, no MRG. No Carotid Bruits. No peripheral edema, warm, nontender Eyes: Conjunctivae clear without exudates or hemorrhage  Neuro: Detailed Neurologic Exam  Speech:    Speech is normal; fluent and spontaneous with normal comprehension.  Cognition:    The patient is oriented to person, place, and time;     recent and remote memory intact;     language fluent;     normal attention, concentration,     fund of knowledge Cranial Nerves:    The pupils are equal, round, and reactive to light. The fundi are normal. Visual fields are full to finger confrontation. Extraocular movements are intact. Trigeminal sensation is intact and the muscles of mastication are normal. The face is symmetric. The palate elevates in the midline. Hearing intact. Voice is normal. Shoulder shrug is normal. The tongue has normal motion without fasciculations.   Coordination:    Normal finger to nose and heel to shin.   Gait:    Heel-toe gait are normal.   Motor Observation:    No asymmetry, no atrophy, and no involuntary movements noted. Tone:    Normal muscle tone.    Posture:    Posture is normal. normal erect  Strength:    Strength is V/V in the upper and lower limbs.      Sensation: intact to LT, cold, pp and vibration     Reflex Exam:  DTR's:    Deep tendon reflexes in the upper and lower extremities are normal bilaterally.  Ajs 1+ symmetrical.  Toes:    The toes  are downgoing bilaterally.   Clonus:    Clonus is absent.    Assessment/Plan:   KELSEY DURFLINGER is a 78 y.o. Tina Mendez female here as requested by Judy Pimple, MD for neuropathy. 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  She has a history of B12 deficiency.  Tingling bottom of feet and legs feel heavy. Neuro exam normal. Strength intact. Sensory to all modalities intact, can heel and toe walk.  Likely small fiber neuropathy. Could be due to B12 deficiency. Will test for a few other causes but most neuropathies, If not diabetic,  are idiopathic.   She states compression socks help and she complains of veins, could it be venous reflux? She states her legs feel heavy. Could this be due to venous cause such as varicose veins? She states her legs are ugly with veins. Agree looks more like spider veins so just something to consider because reflux can cause swelling, heaviness and cramps which she endorses and polyneuropathy doesn't usually cause swelling or feel improved with compression socks.  Discussed causes of neuropathy as below: What are the causes? Answered all questions.  This condition may be caused by: Discussed with patient Certain diseases, such as: Diabetes. This is the most common cause of peripheral neuropathy. Autoimmune diseases, such as rheumatoid arthritis and systemic lupus erythematosus. Nerve diseases that are passed from parent to child (inherited). Kidney disease. Thyroid disease. Other causes may include: Nerve injury. Pressure or stress on a nerve that lasts a long time. Lack (deficiency) of B vitamins. This can result from alcoholism, poor  diet, or a restricted diet. Infections. Some medicines, such as cancer medicines (chemotherapy). Poisonous (toxic) substances, such as lead and mercury, alcohol Too little blood flowing to the legs. Obesity In some cases, the cause of this condition is not known. What are the signs or symptoms? Symptoms of this condition depend on which of your nerves is damaged. Symptoms in the legs, hands, and arms can include: Loss of feeling (numbness) in the feet, hands, or both. Tingling in the feet, hands, or both. Burning pain. Very sensitive skin. Weakness. Not being able to move a part of the body (paralysis). Clumsiness or poor coordination. Muscle twitching. Loss of balance.   Orders Placed This Encounter  Procedures   Vitamin B1   Vitamin B6   B12 and Folate Panel   Methylmalonic acid, serum   Multiple Myeloma Panel (SPEP&IFE w/QIG)   Hemoglobin A1c   No orders of the defined types were placed in this encounter.   Cc: Tower, Audrie Gallus, MD,  Tower, Audrie Gallus, MD  Naomie Dean, MD  [facility] [address] [place], [place] [address]  Phone [phone] Fax [phone]

## 2023-06-17 NOTE — Telephone Encounter (Signed)
 Copied from CRM [id number]. Topic: Clinical - Prescription Issue >> Jun 16, 2023  5:19 PM Presley Raddle C wrote: Reason for CRM: Angelica Chessman from [facility] called and stated that they do not carry Tenivac. She explained that it could be switched to something else instead but that one is a newer kind they don't carry. Please call and advise at [phone].

## 2023-06-17 NOTE — Telephone Encounter (Signed)
 I sent it

## 2023-06-18 ENCOUNTER — Encounter: Payer: Self-pay | Admitting: Neurology

## 2023-06-22 LAB — B12 AND FOLATE PANEL
Folate: 14.7 ng/mL (ref >3.0)
Vitamin B-12: 714 pg/mL (ref 232–1245)

## 2023-06-22 LAB — MULTIPLE MYELOMA PANEL, SERUM
Albumin SerPl Elph-Mcnc: 3.9 g/dL (ref 2.9–4.4)
Albumin/Glob SerPl: 1.5 (ref 0.7–1.7)
Alpha 1: 0.2 g/dL (ref 0.0–0.4)
Alpha2 Glob SerPl Elph-Mcnc: 0.7 g/dL (ref 0.4–1.0)
B-Globulin SerPl Elph-Mcnc: 0.9 g/dL (ref 0.7–1.3)
Gamma Glob SerPl Elph-Mcnc: 0.9 g/dL (ref 0.4–1.8)
Globulin, Total: 2.7 g/dL (ref 2.2–3.9)
IgA/Immunoglobulin A, Serum: 251 mg/dL (ref 64–422)
IgG (Immunoglobin G), Serum: 849 mg/dL (ref 586–1602)
IgM (Immunoglobulin M), Srm: 97 mg/dL (ref 26–217)
Total Protein: 6.6 g/dL (ref 6.0–8.5)

## 2023-06-22 LAB — METHYLMALONIC ACID, SERUM: Methylmalonic Acid: 167 nmol/L (ref 0–378)

## 2023-06-22 LAB — HEMOGLOBIN A1C
Est. average glucose Bld gHb Est-mCnc: 111 mg/dL
Hgb A1c MFr Bld: 5.5 % (ref 4.8–5.6)

## 2023-06-22 LAB — VITAMIN B6: Vitamin B6: 11.3 ug/L (ref 3.4–65.2)

## 2023-06-22 LAB — VITAMIN B1: Thiamine: 141.9 nmol/L (ref 66.5–200.0)

## 2023-06-24 ENCOUNTER — Telehealth: Payer: Self-pay | Admitting: Neurology

## 2023-06-24 ENCOUNTER — Encounter: Payer: Self-pay | Admitting: Neurology

## 2023-06-24 DIAGNOSIS — E531 Pyridoxine deficiency: Secondary | ICD-10-CM

## 2023-06-24 DIAGNOSIS — E519 Thiamine deficiency, unspecified: Secondary | ICD-10-CM

## 2023-06-24 DIAGNOSIS — E538 Deficiency of other specified B group vitamins: Secondary | ICD-10-CM

## 2023-06-24 DIAGNOSIS — G629 Polyneuropathy, unspecified: Secondary | ICD-10-CM

## 2023-06-24 DIAGNOSIS — R7309 Other abnormal glucose: Secondary | ICD-10-CM

## 2023-06-24 NOTE — Telephone Encounter (Signed)
 Thanks for seeing her and thanks for the heads up!

## 2023-06-24 NOTE — Telephone Encounter (Signed)
 Pt returned call and I let patient know per Dr. Lucia Gaskins that  in one lab we found an increase in a normal component of blood. 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 Monoclonal gammopathy of undetermined significance (MGUS) is a condition in which an abnormal protein - known as monoclonal protein or M protein - is in your blood. The protein is produced in a type of white blood cell (plasma cells) in your bone marrow. MGUS usually causes no problems. But sometimes it can progress over years to other disorders, including some forms of blood cancer. It's important to have regular checkups to closely monitor monoclonal gammopathy so that if it does progress, you get earlier treatment. If there's no disease progression, MGUS doesn't require treatment.  Otherwise all the labs I did looked great.   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  Pt did not have any questions at this time.  Did want to go to [facility] facility.  Done.

## 2023-06-24 NOTE — Addendum Note (Signed)
 Addended by: Guy Begin on: 06/24/2023 03:53 PM   Modules accepted: Orders

## 2023-06-24 NOTE — Telephone Encounter (Signed)
 Please call and let patient know(I will put this in a phone note as well): In one lab we found an increase in a normal component of blood. 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 Monoclonal gammopathy of undetermined significance (MGUS) is a condition in which an abnormal protein - known as monoclonal protein or M protein - is in your blood. The protein is produced in a type of white blood cell (plasma cells) in your bone marrow. MGUS usually causes no problems. But sometimes it can progress over years to other disorders, including some forms of blood cancer. It's important to have regular checkups to closely monitor monoclonal gammopathy so that if it does progress, you get earlier treatment. If there's no disease progression, MGUS doesn't require treatment.    Otherwise all the labs I did looked great thanks. 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 (Dr. Lorrine Kin thanks)

## 2023-06-24 NOTE — Telephone Encounter (Signed)
LMVM for pt to call back for lab results.

## 2023-06-24 NOTE — Progress Notes (Signed)
 Please call and let patient know(I will put this in a phone note as well): In one lab we found an increase in a normal component of blood. 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 Monoclonal gammopathy of undetermined significance (MGUS) is a condition in which an abnormal protein - known as monoclonal protein or M protein - is in your blood. The protein is produced in a type of white blood cell (plasma cells) in your bone marrow. MGUS usually causes no problems. But sometimes it can progress over years to other disorders, including some forms of blood cancer. It's important to have regular checkups to closely monitor monoclonal gammopathy so that if it does progress, you get earlier treatment. If there's no disease progression, MGUS doesn't require treatment.     Otherwise all the labs I did looked great thanks. 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

## 2023-07-08 ENCOUNTER — Other Ambulatory Visit: Payer: Self-pay | Admitting: Family Medicine

## 2023-07-08 DIAGNOSIS — Z1231 Encounter for screening mammogram for malignant neoplasm of breast: Secondary | ICD-10-CM

## 2023-07-30 ENCOUNTER — Inpatient Hospital Stay: Attending: Hematology and Oncology

## 2023-07-30 ENCOUNTER — Ambulatory Visit
Admission: RE | Admit: 2023-07-30 | Discharge: 2023-07-30 | Disposition: A | Discharge status: Home / Self Care | Source: Ambulatory Visit | Attending: Family Medicine | Admitting: Family Medicine

## 2023-07-30 ENCOUNTER — Inpatient Hospital Stay: Admitting: Hematology and Oncology

## 2023-07-30 VITALS — BP 133/67 | HR 85 | Temp 98.0°F | Resp 15 | Wt 133.3 lb

## 2023-07-30 DIAGNOSIS — Z853 Personal history of malignant neoplasm of breast: Secondary | ICD-10-CM | POA: Diagnosis not present

## 2023-07-30 DIAGNOSIS — Z1231 Encounter for screening mammogram for malignant neoplasm of breast: Secondary | ICD-10-CM

## 2023-07-30 DIAGNOSIS — M810 Age-related osteoporosis without current pathological fracture: Secondary | ICD-10-CM

## 2023-07-30 DIAGNOSIS — D472 Monoclonal gammopathy: Secondary | ICD-10-CM

## 2023-07-30 LAB — CMP (CANCER CENTER ONLY)
ALT: 12 U/L (ref 0–44)
AST: 17 U/L (ref 15–41)
Albumin: 4.7 g/dL (ref 3.5–5.0)
Alkaline Phosphatase: 69 U/L (ref 38–126)
Anion gap: 4 — ABNORMAL LOW (ref 5–15)
BUN: 17 mg/dL (ref 8–23)
CO2: 29 mmol/L (ref 22–32)
Calcium: 9.8 mg/dL (ref 8.9–10.3)
Chloride: 107 mmol/L (ref 98–111)
Creatinine: 0.74 mg/dL (ref 0.44–1.00)
GFR, Estimated: >60 mL/min (ref >60)
Glucose, Bld: 86 mg/dL (ref 70–99)
Potassium: 4.4 mmol/L (ref 3.5–5.1)
Sodium: 140 mmol/L (ref 135–145)
Total Bilirubin: 0.9 mg/dL (ref 0.0–1.2)
Total Protein: 7.2 g/dL (ref 6.5–8.1)

## 2023-07-30 LAB — CBC WITH DIFFERENTIAL (CANCER CENTER ONLY)
Abs Immature Granulocytes: 0.01 10*3/uL (ref 0.00–0.07)
Basophils Absolute: 0 10*3/uL (ref 0.0–0.1)
Basophils Relative: 1 %
Eosinophils Absolute: 0.1 10*3/uL (ref 0.0–0.5)
Eosinophils Relative: 1 %
HCT: 40.3 % (ref 36.0–46.0)
Hemoglobin: 13.9 g/dL (ref 12.0–15.0)
Immature Granulocytes: 0 %
Lymphocytes Relative: 20 %
Lymphs Abs: 1.2 10*3/uL (ref 0.7–4.0)
MCH: 32 pg (ref 26.0–34.0)
MCHC: 34.5 g/dL (ref 30.0–36.0)
MCV: 92.6 fL (ref 80.0–100.0)
Monocytes Absolute: 0.4 10*3/uL (ref 0.1–1.0)
Monocytes Relative: 7 %
Neutro Abs: 4.1 10*3/uL (ref 1.7–7.7)
Neutrophils Relative %: 71 %
Platelet Count: 188 10*3/uL (ref 150–400)
RBC: 4.35 MIL/uL (ref 3.87–5.11)
RDW: 12.8 % (ref 11.5–15.5)
WBC Count: 5.8 10*3/uL (ref 4.0–10.5)
nRBC: 0 % (ref 0.0–0.2)

## 2023-07-30 LAB — LACTATE DEHYDROGENASE: LDH: 167 U/L (ref 98–192)

## 2023-07-30 NOTE — Progress Notes (Signed)
 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 07/30/2023: establish care with Dr. Rosaline Coma   CHIEF COMPLAINTS/PURPOSE OF CONSULTATION:  "IgA Kappa MGUS "  HISTORY OF PRESENTING ILLNESS:  Tina Mendez 78 y.o. female with medical history significant for cancer of the left breast status postmastectomy and 2002, anal fissures, osteoporosis, and plantar fasciitis who presents for evaluation of a monoclonal gammopathy.  On review of the previous records Tina Mendez was going through a workup for a neuropathy and as part of that workup she had multiple myeloma labs drawn.  M protein was not noted but IFE showed IgA kappa monoclonal protein.  Due to concern for these findings the patient was referred to hematology for further evaluation and management.  On exam today Tina Mendez reports this all began when she was being worked up for neuropathy years ago.  She was found to have low vitamin B12 levels and was on B12 shots for a while but is now on pills.  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  She reports that she has neuropathy in her feet but nowhere else in her body.  On further discussion she reports that her mother died at age 51 from type 2 diabetes and congestive heart failure.  She is unsure her father is because adeptly did have an aneurysm in the stomach.  Her paternal aunt had breast cancer and her paternal uncle had melanoma.  She has 1 daughter who is healthy.  She reports that she is a never smoker  never drinker and previously worked as an Tourist information centre manager.  She reports that she has had some skin cancers in the past and follows with [facility].  Additionally she had breast cancer in 2002 and was treated with mastectomy and followed by Dr. Charolett Copes.  She currently denies any fevers, chills, sweats, nausea vomiting or diarrhea.  She denies any bone or back pain.  A full 10 point ROS is otherwise negative.  MEDICAL HISTORY:  Past Medical History:  Diagnosis Date   Abdominal pain 05/2018   right lower abd pain/one time   Allergy    pollen   Basal cell carcinoma of skin    mainly on face   Cancer (HCC) 2002   L BREAST/had mastectomy   Colon polyp    Hemorrhoids    hx of    History of anal fissures    Osteoporosis    Plantar fasciitis     SURGICAL HISTORY: Past Surgical History:  Procedure Laterality Date   Basal Cell Lesion  2018   removed from nose/ at [facility] hospital   Breast CA signs - Chemo  06/2000   no radiation   BREAST CYST EXCISION  1970   rt   CESAREAN SECTION  1980   1 time   COLONOSCOPY  2014   COLONOSCOPY  08/2020   Dr Elvin Hammer   MASTECTOMY  2002   LEFT    SOCIAL HISTORY: Social History   Socioeconomic History   Marital status: Married    Spouse name: Not on file   Number  of children: Not on file   Years of education: Not on file   Highest education level: Bachelor's degree (e.g., BA, AB, BS)  Occupational History   Occupation: [employer]    Employer: [employer]  Tobacco Use   Smoking status: Never   Smokeless tobacco: Never  Vaping Use   Vaping status: Never Used  Substance and Sexual Activity   Alcohol use: No   Drug use: No   Sexual activity: Yes  Other Topics Concern   Not on file  Social History Narrative   Regular exercise:  walks daily, does yard work/gardening   Left handed    Lives at home with husband    Caffeine: 1 cup in the morning   Social Drivers of Health   Financial Resource Strain: Low  Risk  (06/13/2023)   Overall Financial Resource Strain (CARDIA)    Difficulty of Paying Living Expenses: Not hard at all  Food Insecurity: No Food Insecurity (06/13/2023)   Hunger Vital Sign    Worried About Running Out of Food in the Last Year: Never true    Ran Out of Food in the Last Year: Never true  Transportation Needs: No Transportation Needs (06/13/2023)   PRAPARE - Administrator, Civil Service (Medical): No    Lack of Transportation (Non-Medical): No  Physical Activity: Sufficiently Active (06/13/2023)   Exercise Vital Sign    Days of Exercise per Week: 5 days    Minutes of Exercise per Session: 30 min  Stress: No Stress Concern Present (06/13/2023)   [employer] of Occupational Health - Occupational Stress Questionnaire    Feeling of Stress : Not at all  Social Connections: Socially Integrated (06/13/2023)   Social Connection and Isolation Panel [NHANES]    Frequency of Communication with Friends and Family: Three times a week    Frequency of Social Gatherings with Friends and Family: Three times a week    Attends Religious Services: More than 4 times per year    Active Member of Clubs or Organizations: Yes    Attends Banker Meetings: More than 4 times per year    Marital Status: Married  Catering manager Violence: Not At Risk (02/04/2023)   Humiliation, Afraid, Rape, and Kick questionnaire    Fear of Current or Ex-Partner: No    Emotionally Abused: No    Physically Abused: No    Sexually Abused: No    FAMILY HISTORY: Family History  Problem Relation Age of Onset   Diabetes Mother    Heart disease Mother    Heart failure Mother    Varicose Veins Mother    Colon polyps Father    Cancer Paternal Aunt        breast   Cancer Paternal Uncle        Lung CA, smoker   Colon cancer Neg Hx    Breast cancer Neg Hx    Esophageal cancer Neg Hx    Rectal cancer Neg Hx    Stomach cancer Neg Hx    Neuropathy Neg Hx    Neural tube defect Neg Hx     Neurologic Disorder Neg Hx     ALLERGIES:  is allergic to sulfonamide derivatives.  MEDICATIONS:  Current Outpatient Medications  Medication Sig Dispense Refill   alendronate  (FOSAMAX ) 70 MG tablet Take 1 tablet (70 mg total) by mouth every 7 (seven) days. Take with a full glass of water on an empty stomach. 12 tablet 3   Calcium-Magnesium-Vitamin  D (CALCIUM 1200+D3 PO) Take 1 capsule by mouth daily. Vitamin D3 800 IU     cyanocobalamin  (VITAMIN B12) 500 MCG tablet Take 500 mcg by mouth daily.     No current facility-administered medications for this visit.    REVIEW OF SYSTEMS:   Constitutional: ( - ) fevers, ( - )  chills , ( - ) night sweats Eyes: ( - ) blurriness of vision, ( - ) double vision, ( - ) watery eyes Ears, nose, mouth, throat, and face: ( - ) mucositis, ( - ) sore throat Respiratory: ( - ) cough, ( - ) dyspnea, ( - ) wheezes Cardiovascular: ( - ) palpitation, ( - ) chest discomfort, ( - ) lower extremity swelling Gastrointestinal:  ( - ) nausea, ( - ) heartburn, ( - ) change in bowel habits Skin: ( - ) abnormal skin rashes Lymphatics: ( - ) new lymphadenopathy, ( - ) easy bruising Neurological: ( - ) numbness, ( - ) tingling, ( - ) new weaknesses Behavioral/Psych: ( - ) mood change, ( - ) new changes  All other systems were reviewed with the patient and are negative.  PHYSICAL EXAMINATION:  Vitals:   07/30/23 1349  BP: 133/67  Pulse: 85  Resp: 15  Temp: 98 F (36.7 C)  SpO2: 100%   Filed Weights   07/30/23 1349  Weight: 133 lb 4.8 oz (60.5 kg)    GENERAL: well appearing elderly Caucasian female in NAD  SKIN: skin color, texture, turgor are normal, no rashes or significant lesions EYES: conjunctiva are pink and non-injected, sclera clear LUNGS: clear to auscultation and percussion with normal breathing effort HEART: regular rate & rhythm and no murmurs and no lower extremity edema Musculoskeletal: no cyanosis of digits and no clubbing  PSYCH: alert &  oriented x 3, fluent speech NEURO: no focal motor/sensory deficits  LABORATORY DATA:  I have reviewed the data as listed    Latest Ref Rng & Units 07/30/2023    2:43 PM 02/02/2023    9:20 AM 01/24/2022    8:52 AM  CBC  WBC 4.0 - 10.5 K/uL 5.8  4.5  4.0   Hemoglobin 12.0 - 15.0 g/dL 25.9  56.3  87.5   Hematocrit 36.0 - 46.0 % 40.3  42.1  40.5   Platelets 150 - 400 K/uL 188  181.0  180.0        Latest Ref Rng & Units 06/17/2023    2:03 PM 02/02/2023    9:20 AM 01/24/2022    8:52 AM  CMP  Glucose 70 - 99 mg/dL  97  92   BUN 6 - 23 mg/dL  13  15   Creatinine 6.43 - 1.20 mg/dL  3.29  5.18   Sodium 841 - 145 mEq/L  141  142   Potassium 3.5 - 5.1 mEq/L  4.8  4.7   Chloride 96 - 112 mEq/L  106  107   CO2 19 - 32 mEq/L  29  27   Calcium 8.4 - 10.5 mg/dL  9.7  9.6   Total Protein 6.0 - 8.5 g/dL 6.6  6.4  6.4   Total Bilirubin 0.2 - 1.2 mg/dL  1.0  0.8   Alkaline Phos 39 - 117 U/L  64  65   AST 0 - 37 U/L  18  17   ALT 0 - 35 U/L  12  11      ASSESSMENT & PLAN Tina Mendez 78 y.o. female with medical history  significant for cancer of the left breast status postmastectomy and 2002, anal fissures, osteoporosis, and plantar fasciitis who presents for evaluation of a monoclonal gammopathy.  After review of the labs, review of the records, and discussion with the patient the patients findings are most consistent with an IgA kappa monoclonal gammopathy.   Monoclonal Gammopathies are a group of medical conditions defined by the presence of a monoclonal protein (an M protein) in the blood or urine. Monoclonal gammopathies include monoclonal gammopathy of unknown significance (MGUS), Monoclonal gammopathies of renal or neurological significance,  smoldering multiple myeloma (SMM), multiple myeloma (MM), AL amyloidosis, and Waldenstrom macroglobulinemia. The goal of the initial workup is to determine which monoclonal gammopathy a patient has. The workup consists of evaluating protein in the  serum (with serum protein electrophoresis (SPEP) and serum free light chains) , evaluating protein in the urine (UPEP), and evaluation of the skeleton (DG Bone Met Survey) to assure no lytic lesions. Baseline bloodwork includes CMP and CBC. If no CRAB criteria or high risk criteria are noted then the diagnosis is MGUS. MGUS must be followed with bloodwork periodically to assure it does not convert to multiple myeloma (occurs to approximately 1% of patients per year). If there are CRAB criteria or high risk features (such as elevated serum free light chain ratio (taking into account renal function), a non IgG M protein, or M protein >1.5) then a bone marrow biopsy must be pursued.    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  If we also see an IgA kappa monoclonal protein she will require bone marrow biopsy. --RTC in 6 months or sooner if intervention is required.   Orders Placed This Encounter  Procedures   DG Bone Survey Met    Standing Status:   Future    Expected Date:   08/06/2023    Expiration Date:   07/29/2024    Reason for Exam (SYMPTOM  OR DIAGNOSIS REQUIRED):   MGUS, requesting evaluation for lytic lesions    Preferred imaging location?:   [facility]   CBC with Differential (Cancer Center Only)    Standing Status:   Future    Number of Occurrences:   1    Expiration Date:   07/29/2024   CMP (Cancer Center only)    Standing Status:   Future    Number of Occurrences:   1    Expiration Date:   07/29/2024   Lactate dehydrogenase (LDH)    Standing Status:   Future    Number of Occurrences:   1    Expiration Date:   07/29/2024   Multiple Myeloma Panel (SPEP&IFE w/QIG)    Standing Status:   Future     Number of Occurrences:   1    Expiration Date:   07/29/2024   Kappa/lambda light chains    Standing Status:   Future    Number of Occurrences:   1    Expiration Date:   07/29/2024   24-Hr Ur UPEP/UIFE/Light Chains/TP    Standing Status:   Future    Expiration Date:   07/29/2024    All questions were answered. The patient knows to call the clinic with any problems, questions or concerns.  A total of more than 60  minutes were spent on this encounter with face-to-face time and non-face-to-face time, including preparing to see the patient, ordering tests and/or medications, counseling the patient and coordination of care as outlined above.   Rogerio Clay, MD Department of Hematology/Oncology [facility] at [facility] Phone: [phone] Pager: [phone] Email: [email or web address]  07/30/2023 3:09 PM

## 2023-07-31 LAB — KAPPA/LAMBDA LIGHT CHAINS
Kappa free light chain: 47.6 mg/L — ABNORMAL HIGH (ref 3.3–19.4)
Kappa, lambda light chain ratio: 5.06 — ABNORMAL HIGH (ref 0.26–1.65)
Lambda free light chains: 9.4 mg/L (ref 5.7–26.3)

## 2023-08-03 ENCOUNTER — Telehealth: Payer: Self-pay | Admitting: Hematology and Oncology

## 2023-08-03 ENCOUNTER — Encounter: Payer: Self-pay | Admitting: Family Medicine

## 2023-08-03 ENCOUNTER — Telehealth: Payer: Self-pay | Admitting: *Deleted

## 2023-08-03 ENCOUNTER — Other Ambulatory Visit: Payer: Self-pay | Admitting: Hematology and Oncology

## 2023-08-03 DIAGNOSIS — M810 Age-related osteoporosis without current pathological fracture: Secondary | ICD-10-CM | POA: Diagnosis not present

## 2023-08-03 DIAGNOSIS — D472 Monoclonal gammopathy: Secondary | ICD-10-CM | POA: Diagnosis not present

## 2023-08-03 DIAGNOSIS — Z853 Personal history of malignant neoplasm of breast: Secondary | ICD-10-CM | POA: Diagnosis not present

## 2023-08-03 LAB — MULTIPLE MYELOMA PANEL, SERUM
Albumin SerPl Elph-Mcnc: 4.2 g/dL (ref 2.9–4.4)
Albumin/Glob SerPl: 1.7 (ref 0.7–1.7)
Alpha 1: 0.2 g/dL (ref 0.0–0.4)
Alpha2 Glob SerPl Elph-Mcnc: 0.6 g/dL (ref 0.4–1.0)
B-Globulin SerPl Elph-Mcnc: 0.9 g/dL (ref 0.7–1.3)
Gamma Glob SerPl Elph-Mcnc: 0.8 g/dL (ref 0.4–1.8)
Globulin, Total: 2.5 g/dL (ref 2.2–3.9)
IgA: 246 mg/dL (ref 64–422)
IgG (Immunoglobin G), Serum: 831 mg/dL (ref 586–1602)
IgM (Immunoglobulin M), Srm: 91 mg/dL (ref 26–217)
Total Protein ELP: 6.7 g/dL (ref 6.0–8.5)

## 2023-08-03 NOTE — Telephone Encounter (Signed)
-----   Message from Rogerio Clay IV sent at 08/03/2023  3:03 PM EDT ----- Please let Ms. Bevens know that her initial blood work shows higher levels of protein than anticipated.  We will need to pursue a bone marrow biopsy to evaluate further.  Additionally we do still require UPEP in the bone survey (being performed tomorrow). ----- Message ----- From: Dannis Dy, [facility] [place] Sent: 07/30/2023   3:03 PM EDT To: Ander Bame, MD

## 2023-08-03 NOTE — Telephone Encounter (Signed)
 TCT patient regarding recent lab results. No answer. Was able to leave vm message for her to return this call to [phone] at her convenience

## 2023-08-04 ENCOUNTER — Ambulatory Visit (HOSPITAL_COMMUNITY)
Admission: RE | Admit: 2023-08-04 | Discharge: 2023-08-04 | Disposition: A | Discharge status: Home / Self Care | Source: Ambulatory Visit | Attending: Hematology and Oncology | Admitting: Hematology and Oncology

## 2023-08-04 DIAGNOSIS — M47814 Spondylosis without myelopathy or radiculopathy, thoracic region: Secondary | ICD-10-CM | POA: Diagnosis not present

## 2023-08-04 DIAGNOSIS — D472 Monoclonal gammopathy: Secondary | ICD-10-CM | POA: Insufficient documentation

## 2023-08-04 DIAGNOSIS — M47812 Spondylosis without myelopathy or radiculopathy, cervical region: Secondary | ICD-10-CM | POA: Diagnosis not present

## 2023-08-04 NOTE — Telephone Encounter (Signed)
 Entered in error

## 2023-08-06 LAB — UPEP/UIFE/LIGHT CHAINS/TP, 24-HR UR
% BETA, Urine: 0 %
ALPHA 1 URINE: 0 %
Albumin, U: 100 %
Alpha 2, Urine: 0 %
Free Kappa Lt Chains,Ur: 2.99 mg/L (ref 1.17–86.46)
Free Kappa/Lambda Ratio: >4.33 (ref 1.83–14.26)
Free Lambda Lt Chains,Ur: <0.69 mg/L (ref 0.27–15.21)
GAMMA GLOBULIN URINE: 0 %
Total Protein, Urine-Ur/day: 99 mg/(24.h) (ref 30–150)
Total Protein, Urine: 5.2 mg/dL
Total Volume: 1900

## 2023-08-10 ENCOUNTER — Encounter: Payer: Self-pay | Admitting: Family Medicine

## 2023-08-11 ENCOUNTER — Other Ambulatory Visit: Payer: Medicare Other

## 2023-08-11 DIAGNOSIS — D2270 Melanocytic nevi of unspecified lower limb, including hip: Secondary | ICD-10-CM | POA: Diagnosis not present

## 2023-08-11 DIAGNOSIS — Z85828 Personal history of other malignant neoplasm of skin: Secondary | ICD-10-CM | POA: Diagnosis not present

## 2023-08-11 DIAGNOSIS — D225 Melanocytic nevi of trunk: Secondary | ICD-10-CM | POA: Diagnosis not present

## 2023-08-11 DIAGNOSIS — D2260 Melanocytic nevi of unspecified upper limb, including shoulder: Secondary | ICD-10-CM | POA: Diagnosis not present

## 2023-08-11 DIAGNOSIS — L570 Actinic keratosis: Secondary | ICD-10-CM | POA: Diagnosis not present

## 2023-08-11 DIAGNOSIS — C44712 Basal cell carcinoma of skin of right lower limb, including hip: Secondary | ICD-10-CM | POA: Diagnosis not present

## 2023-08-11 DIAGNOSIS — L814 Other melanin hyperpigmentation: Secondary | ICD-10-CM | POA: Diagnosis not present

## 2023-08-11 DIAGNOSIS — L565 Disseminated superficial actinic porokeratosis (DSAP): Secondary | ICD-10-CM | POA: Diagnosis not present

## 2023-08-11 DIAGNOSIS — D492 Neoplasm of unspecified behavior of bone, soft tissue, and skin: Secondary | ICD-10-CM | POA: Diagnosis not present

## 2023-08-11 DIAGNOSIS — L821 Other seborrheic keratosis: Secondary | ICD-10-CM | POA: Diagnosis not present

## 2023-08-11 DIAGNOSIS — C44719 Basal cell carcinoma of skin of left lower limb, including hip: Secondary | ICD-10-CM | POA: Diagnosis not present

## 2023-08-11 DIAGNOSIS — C44311 Basal cell carcinoma of skin of nose: Secondary | ICD-10-CM | POA: Diagnosis not present

## 2023-08-11 DIAGNOSIS — L578 Other skin changes due to chronic exposure to nonionizing radiation: Secondary | ICD-10-CM | POA: Diagnosis not present

## 2023-08-11 DIAGNOSIS — Z1283 Encounter for screening for malignant neoplasm of skin: Secondary | ICD-10-CM | POA: Diagnosis not present

## 2023-08-12 ENCOUNTER — Telehealth: Payer: Self-pay | Admitting: *Deleted

## 2023-08-12 NOTE — Telephone Encounter (Signed)
-----   Message from Rogerio Clay IV sent at 08/03/2023  3:03 PM EDT ----- Please let Ms. Bevens know that her initial blood work shows higher levels of protein than anticipated.  We will need to pursue a bone marrow biopsy to evaluate further.  Additionally we do still require UPEP in the bone survey (being performed tomorrow). ----- Message ----- From: Dannis Dy, [facility] [place] Sent: 07/30/2023   3:03 PM EDT To: Ander Bame, MD

## 2023-08-12 NOTE — Telephone Encounter (Signed)
 TCT patient  (2nd attempt) regarding lab results. Spoke to her. Advised hat her initial blood work shows higher levels of protein than anticipated. We will need to pursue a bone marrow biopsy to evaluate further. This procedure has been scheduled for 08/24/23. Advised that her 24 hour urine was negative.  Advised that the results of her bone survey have not been posted yet. Pt voiced understanding.  She does state that she needs Moh's surgery on her nose but that it has not been scheduled yet.  Advised that we will schedule her f/u with Dr. Rosaline Coma with that appt in mind.

## 2023-08-17 ENCOUNTER — Encounter: Payer: Self-pay | Admitting: Physician Assistant

## 2023-08-21 ENCOUNTER — Other Ambulatory Visit: Payer: Self-pay | Admitting: Radiology

## 2023-08-21 DIAGNOSIS — D472 Monoclonal gammopathy: Secondary | ICD-10-CM

## 2023-08-21 NOTE — Progress Notes (Signed)
 Patient for IR Bone Marrow Biopsy on Monday 08/24/23, I called and spoke with the patient on the phone and gave pre-procedure instructions. Pt was made aware to be here at 7:30a, NPO after MN prior to procedure as well as driver post procedure/recovery/discharge. Pt stated understanding.  Called 08/21/23

## 2023-08-23 NOTE — H&P (Signed)
 Chief Complaint: Patient was seen in consultation today for IGA monoclonal gammopathy of unknown significance, with consideration for bone marrow biopsy.  Referring Provider(s): Dr. Amparo Balk, MD   Supervising Physician: Myrlene Asper  Patient Status: [facility] - Out-pt  Patient is Full Code  History of Present Illness: Tina Mendez is a 78 y.o. female  with PMHx notable for left breast cancer, osteoporosis, and basal cell carcinoma.  Per Dr. Les Rao progress note on 4/24: "On review of the previous records Ms. Celaya was going through a workup for a neuropathy and as part of that workup she had multiple myeloma labs drawn.  M protein was not noted but IFE showed IgA kappa monoclonal protein.  Due to concern for these findings the patient was referred to hematology for further evaluation and management.   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  If we also see an IgA kappa monoclonal protein she will require bone marrow biopsy. --RTC in 6 months or sooner if intervention is required."   Interventional Radiology was requested for bone marrow biopsy and aspiration. Patient is scheduled for same in IR today.   Patient is currently without any significant complaints. Patient is alert and laying in bed, calm. Patient denies any fevers, headache, chest pain, SOB, cough, abdominal pain, nausea, vomiting or bleeding.     Past Medical History:  Diagnosis Date   Abdominal pain 05/2018   right lower abd pain/one time   Allergy     pollen   Basal cell carcinoma of skin    mainly on face   Cancer (HCC) 2002   L BREAST/had mastectomy   Colon polyp    Hemorrhoids    hx of    History of anal fissures    Osteoporosis    Plantar fasciitis     Past Surgical History:  Procedure Laterality Date   Basal Cell Lesion  2018   removed from nose/ [facility]   Breast CA signs - Chemo  06/2000   no radiation   BREAST CYST EXCISION  1970   rt   CESAREAN SECTION  1980   1 time   COLONOSCOPY  2014   COLONOSCOPY  08/2020   Dr Elvin Hammer   MASTECTOMY  2002   LEFT    Allergies: Sulfonamide derivatives  Medications: Prior to Admission medications   Medication Sig Start Date End Date Taking? Authorizing Provider  alendronate  (FOSAMAX ) 70 MG tablet Take 1 tablet (70 mg total) by mouth every 7 (seven) days. Take with a full glass of water on an empty stomach. 06/15/23   Tower, Manley Seeds, MD  Calcium-Magnesium-Vitamin D  (CALCIUM 1200+D3 PO) Take 1 capsule by mouth daily. Vitamin D3 800 IU    [provider]  cyanocobalamin  (VITAMIN B12) 500 MCG tablet Take 500 mcg by mouth daily.    [provider]     Family History  Problem Relation Age of Onset   Diabetes Mother    Heart disease Mother    Heart failure Mother    Varicose Veins Mother  Colon polyps Father    Cancer Paternal Aunt        breast   Cancer Paternal Uncle        Lung CA, smoker   Colon cancer Neg Hx    Breast cancer Neg Hx    Esophageal cancer Neg Hx    Rectal cancer Neg Hx    Stomach cancer Neg Hx    Neuropathy Neg Hx    Neural tube defect Neg Hx    Neurologic Disorder Neg Hx     Social History   Socioeconomic History   Marital status: Married    Spouse name: Not on file   Number of children: Not on file   Years of education: Not on file   Highest education level: Bachelor's degree (e.g., BA, AB, BS)  Occupational History   Occupation: [employer]    Employer: [employer]  Tobacco Use   Smoking  status: Never   Smokeless tobacco: Never  Vaping Use   Vaping status: Never Used  Substance and Sexual Activity   Alcohol use: No   Drug use: No   Sexual activity: Yes  Other Topics Concern   Not on file  Social History Narrative   Regular exercise:  walks daily, does yard work/gardening   Left handed    Lives at home with husband    Caffeine: 1 cup in the morning   Social Drivers of Health   Financial Resource Strain: Low Risk  (06/13/2023)   Overall Financial Resource Strain (CARDIA)    Difficulty of Paying Living Expenses: Not hard at all  Food Insecurity: No Food Insecurity (06/13/2023)   Hunger Vital Sign    Worried About Running Out of Food in the Last Year: Never true    Ran Out of Food in the Last Year: Never true  Transportation Needs: No Transportation Needs (06/13/2023)   PRAPARE - Administrator, Civil Service (Medical): No    Lack of Transportation (Non-Medical): No  Physical Activity: Sufficiently Active (06/13/2023)   Exercise Vital Sign    Days of Exercise per Week: 5 days    Minutes of Exercise per Session: 30 min  Stress: No Stress Concern Present (06/13/2023)   [employer] of Occupational Health - Occupational Stress Questionnaire    Feeling of Stress : Not at all  Social Connections: Socially Integrated (06/13/2023)   Social Connection and Isolation Panel [NHANES]    Frequency of Communication with Friends and Family: Three times a week    Frequency of Social Gatherings with Friends and Family: Three times a week    Attends Religious Services: More than 4 times per year    Active Member of Clubs or Organizations: Yes    Attends Banker Meetings: More than 4 times per year    Marital Status: Married     Review of Systems: A 12 point ROS discussed and pertinent positives are indicated in the HPI above.  All other systems are negative.  Vital Signs: There were no vitals taken for this visit.  Advance Care Plan: The advanced care  place/surrogate decision maker was discussed at the time of visit and the patient did not wish to discuss or was not able to name a surrogate decision maker or provide an advance care plan.  Physical Exam Constitutional:      General: She is not in acute distress.    Appearance: Normal appearance.  HENT:     Mouth/Throat:     Mouth:  Mucous membranes are dry.  Cardiovascular:     Rate and Rhythm: Normal rate and regular rhythm.     Heart sounds: No murmur heard. Pulmonary:     Effort: Pulmonary effort is normal.     Breath sounds: Normal breath sounds. No wheezing.  Musculoskeletal:        General: Normal range of motion.     Cervical back: Normal range of motion.  Skin:    General: Skin is warm and dry.  Neurological:     Mental Status: She is alert and oriented to person, place, and time.  Psychiatric:        Mood and Affect: Mood normal.        Behavior: Behavior normal.        Thought Content: Thought content normal.        Judgment: Judgment normal.     Imaging: DG Bone Survey Met Result Date: 08/12/2023 CLINICAL DATA:  MGUS EXAM: METASTATIC BONE SURVEY COMPARISON:  None Available. FINDINGS: Metastatic bone survey was performed. Frontal view the chest demonstrates lungs to be clear. Cardiac shadow is within normal limits. No rib abnormality is seen. Degenerative changes of the cervical spine are noted. No lytic or sclerotic lesions are seen. No lytic or sclerotic lesions are noted within the skull. Upper extremities show no lytic or sclerotic lesions. Mild degenerative changes of the thoracic and lumbar spines are seen. No compression deformity is noted. No lytic or sclerotic lesions are seen. The pelvic bony structures are within normal limits. Bilateral lower extremities show no lytic or sclerotic lesions. IMPRESSION: No lytic or sclerotic lesions are noted. Scattered degenerative changes are noted. Electronically Signed   By: Violeta Grey M.D.   On: 08/12/2023 19:40   MM 3D  SCREENING MAMMOGRAM UNILATERAL RIGHT BREAST Result Date: 08/03/2023 CLINICAL DATA:  Screening. EXAM: DIGITAL SCREENING UNILATERAL RIGHT MAMMOGRAM WITH CAD AND TOMOSYNTHESIS TECHNIQUE: Right screening digital craniocaudal and mediolateral oblique mammograms were obtained. Right screening digital breast tomosynthesis was performed. The images were evaluated with computer-aided detection. COMPARISON:  Previous exam(s). ACR Breast Density Category b: There are scattered areas of fibroglandular density. FINDINGS: There are no findings suspicious for malignancy. IMPRESSION: No mammographic evidence of malignancy. A result letter of this screening mammogram will be mailed directly to the patient. RECOMMENDATION: Screening mammogram in one year. (Code:SM-B-01Y) BI-RADS CATEGORY  1: Negative. Electronically Signed   By: Anna Barnes M.D.   On: 08/03/2023 09:05    Labs:  CBC: Recent Labs    02/02/23 0920 07/30/23 1443  WBC 4.5 5.8  HGB 13.9 13.9  HCT 42.1 40.3  PLT 181.0 188    COAGS: No results for input(s): "INR", "APTT" in the last 8760 hours.  BMP: Recent Labs    02/02/23 0920 07/30/23 1443  NA 141 140  K 4.8 4.4  CL 106 107  CO2 29 29  GLUCOSE 97 86  BUN 13 17  CALCIUM 9.7 9.8  CREATININE 0.76 0.74  GFRNONAA  --  >60    LIVER FUNCTION TESTS: Recent Labs    02/02/23 0920 06/17/23 1403 07/30/23 1443  BILITOT 1.0  --  0.9  AST 18  --  17  ALT 12  --  12  ALKPHOS 64  --  69  PROT 6.4 6.6 7.2  ALBUMIN 4.3  --  4.7    TUMOR MARKERS: No results for input(s): "AFPTM", "CEA", "CA199", "CHROMGRNA" in the last 8760 hours.  Assessment and Plan: Per Dr. Les Rao progress note on 4/24: "  On review of the previous records Ms. Heatley was going through a workup for a neuropathy and as part of that workup she had multiple myeloma labs drawn.  M protein was not noted but IFE showed IgA kappa monoclonal protein.  Due to concern for these findings the patient was referred to hematology  for further evaluation and management.   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 "  Patient presents for scheduled bone marrow biopsy and aspiration in IR today.  Patient has been NPO since midnight.  All labs and medications are within acceptable parameters.  No pertinent allergies.   Risks and benefits of bone marrow biopsy and aspiration was discussed with the patient and/or patient's family including, but not limited to bleeding, infection, damage to adjacent structures or low yield requiring additional tests.  All of the questions were answered and there is agreement to proceed.  Consent signed and in chart.      Thank you for allowing our service to participate in Tina Mendez 's care.  Electronically Signed: Lovena Rubinstein, PA-C   08/23/2023, 11:15 PM      I spent a total of 30 Minutes in face to face in clinical consultation, greater than 50% of which was counseling/coordinating care for IGA monoclonal gammopathy of unknown significance, with consideration for bone marrow biopsy.

## 2023-08-24 ENCOUNTER — Ambulatory Visit
Admission: RE | Admit: 2023-08-24 | Discharge: 2023-08-24 | Disposition: A | Discharge status: Home / Self Care | Source: Ambulatory Visit | Attending: Hematology and Oncology | Admitting: Hematology and Oncology

## 2023-08-24 DIAGNOSIS — D72822 Plasmacytosis: Secondary | ICD-10-CM | POA: Insufficient documentation

## 2023-08-24 DIAGNOSIS — D759 Disease of blood and blood-forming organs, unspecified: Secondary | ICD-10-CM | POA: Diagnosis not present

## 2023-08-24 DIAGNOSIS — D472 Monoclonal gammopathy: Secondary | ICD-10-CM | POA: Diagnosis not present

## 2023-08-24 LAB — CBC WITH DIFFERENTIAL/PLATELET
Abs Immature Granulocytes: 0.02 10*3/uL (ref 0.00–0.07)
Basophils Absolute: 0 10*3/uL (ref 0.0–0.1)
Basophils Relative: 1 %
Eosinophils Absolute: 0.1 10*3/uL (ref 0.0–0.5)
Eosinophils Relative: 1 %
HCT: 42.3 % (ref 36.0–46.0)
Hemoglobin: 14.1 g/dL (ref 12.0–15.0)
Immature Granulocytes: 0 %
Lymphocytes Relative: 24 %
Lymphs Abs: 1.2 10*3/uL (ref 0.7–4.0)
MCH: 32.1 pg (ref 26.0–34.0)
MCHC: 33.3 g/dL (ref 30.0–36.0)
MCV: 96.4 fL (ref 80.0–100.0)
Monocytes Absolute: 0.3 10*3/uL (ref 0.1–1.0)
Monocytes Relative: 6 %
Neutro Abs: 3.4 10*3/uL (ref 1.7–7.7)
Neutrophils Relative %: 68 %
Platelets: 182 10*3/uL (ref 150–400)
RBC: 4.39 MIL/uL (ref 3.87–5.11)
RDW: 12.6 % (ref 11.5–15.5)
WBC: 5 10*3/uL (ref 4.0–10.5)
nRBC: 0 % (ref 0.0–0.2)

## 2023-08-24 MED ORDER — LIDOCAINE 1 % OPTIME INJ - NO CHARGE
10.0000 mL | Freq: Once | INTRAMUSCULAR | Status: AC
  Administered 2023-08-24: 10 mL via INTRADERMAL
  Filled 2023-08-24: qty 10

## 2023-08-24 MED ORDER — HEPARIN SOD (PORK) LOCK FLUSH 100 UNIT/ML IV SOLN
INTRAVENOUS | Status: AC
  Filled 2023-08-24: qty 5

## 2023-08-24 MED ORDER — MIDAZOLAM HCL 2 MG/2ML IJ SOLN
INTRAMUSCULAR | Status: AC | PRN
Start: 2023-08-24 — End: 2023-08-24
  Administered 2023-08-24: 1 mg via INTRAVENOUS

## 2023-08-24 MED ORDER — FENTANYL CITRATE (PF) 100 MCG/2ML IJ SOLN
INTRAMUSCULAR | Status: AC | PRN
Start: 2023-08-24 — End: 2023-08-24
  Administered 2023-08-24: 50 ug via INTRAVENOUS

## 2023-08-24 MED ORDER — SODIUM CHLORIDE 0.9 % IV SOLN: INTRAVENOUS | Status: DC

## 2023-08-24 MED ORDER — FENTANYL CITRATE (PF) 100 MCG/2ML IJ SOLN
INTRAMUSCULAR | Status: AC
  Filled 2023-08-24: qty 2

## 2023-08-24 MED ORDER — MIDAZOLAM HCL 2 MG/2ML IJ SOLN
INTRAMUSCULAR | Status: AC
  Filled 2023-08-24: qty 2

## 2023-08-24 NOTE — Progress Notes (Signed)
 Patient clinically stable post IR BMB per Dr Mabel Savage, tolerated well. Received Versed  1 mg along with Fentanyl  50 Mcg IV for procedure. Report given TO Shireen Dory RN post procedure/specials/1

## 2023-08-24 NOTE — Procedures (Signed)
 Interventional Radiology Procedure Note  Procedure: Image guided aspirate and core biopsy of left posterior iliac bone Complications: None Recommendations: - Bedrest supine x 1 hrs - OTC's PRN  Pain - Follow biopsy results - 1 hr dc home - advance diet  Signed,  Yvone Neu. Loreta Ave, DO

## 2023-08-26 LAB — SURGICAL PATHOLOGY

## 2023-09-02 ENCOUNTER — Encounter (HOSPITAL_COMMUNITY): Payer: Self-pay | Admitting: Hematology and Oncology

## 2023-09-03 ENCOUNTER — Inpatient Hospital Stay (HOSPITAL_BASED_OUTPATIENT_CLINIC_OR_DEPARTMENT_OTHER): Admitting: Hematology and Oncology

## 2023-09-03 ENCOUNTER — Encounter (HOSPITAL_COMMUNITY): Payer: Self-pay | Admitting: Hematology and Oncology

## 2023-09-03 ENCOUNTER — Inpatient Hospital Stay: Attending: Hematology and Oncology

## 2023-09-03 ENCOUNTER — Other Ambulatory Visit: Payer: Self-pay | Admitting: Hematology and Oncology

## 2023-09-03 VITALS — BP 134/70 | HR 84 | Temp 97.6°F | Resp 16 | Wt 129.6 lb

## 2023-09-03 DIAGNOSIS — Z803 Family history of malignant neoplasm of breast: Secondary | ICD-10-CM | POA: Insufficient documentation

## 2023-09-03 DIAGNOSIS — D472 Monoclonal gammopathy: Secondary | ICD-10-CM | POA: Insufficient documentation

## 2023-09-03 DIAGNOSIS — Z801 Family history of malignant neoplasm of trachea, bronchus and lung: Secondary | ICD-10-CM | POA: Insufficient documentation

## 2023-09-03 LAB — CBC WITH DIFFERENTIAL (CANCER CENTER ONLY)
Abs Immature Granulocytes: 0.01 10*3/uL (ref 0.00–0.07)
Basophils Absolute: 0 10*3/uL (ref 0.0–0.1)
Basophils Relative: 1 %
Eosinophils Absolute: 0.1 10*3/uL (ref 0.0–0.5)
Eosinophils Relative: 2 %
HCT: 42.8 % (ref 36.0–46.0)
Hemoglobin: 14.4 g/dL (ref 12.0–15.0)
Immature Granulocytes: 0 %
Lymphocytes Relative: 28 %
Lymphs Abs: 1.1 10*3/uL (ref 0.7–4.0)
MCH: 31.8 pg (ref 26.0–34.0)
MCHC: 33.6 g/dL (ref 30.0–36.0)
MCV: 94.5 fL (ref 80.0–100.0)
Monocytes Absolute: 0.3 10*3/uL (ref 0.1–1.0)
Monocytes Relative: 7 %
Neutro Abs: 2.5 10*3/uL (ref 1.7–7.7)
Neutrophils Relative %: 62 %
Platelet Count: 193 10*3/uL (ref 150–400)
RBC: 4.53 MIL/uL (ref 3.87–5.11)
RDW: 12.5 % (ref 11.5–15.5)
WBC Count: 4 10*3/uL (ref 4.0–10.5)
nRBC: 0 % (ref 0.0–0.2)

## 2023-09-03 LAB — CMP (CANCER CENTER ONLY)
ALT: 12 U/L (ref 0–44)
AST: 18 U/L (ref 15–41)
Albumin: 4.5 g/dL (ref 3.5–5.0)
Alkaline Phosphatase: 61 U/L (ref 38–126)
Anion gap: 6 (ref 5–15)
BUN: 15 mg/dL (ref 8–23)
CO2: 28 mmol/L (ref 22–32)
Calcium: 9.8 mg/dL (ref 8.9–10.3)
Chloride: 107 mmol/L (ref 98–111)
Creatinine: 0.77 mg/dL (ref 0.44–1.00)
GFR, Estimated: >60 mL/min (ref >60)
Glucose, Bld: 107 mg/dL — ABNORMAL HIGH (ref 70–99)
Potassium: 4.3 mmol/L (ref 3.5–5.1)
Sodium: 141 mmol/L (ref 135–145)
Total Bilirubin: 1.1 mg/dL (ref 0.0–1.2)
Total Protein: 7.2 g/dL (ref 6.5–8.1)

## 2023-09-03 LAB — LACTATE DEHYDROGENASE: LDH: 149 U/L (ref 98–192)

## 2023-09-03 NOTE — Addendum Note (Signed)
 Addended by: Valma Gazella A on: 09/03/2023 02:48 PM   Modules accepted: Orders

## 2023-09-03 NOTE — Progress Notes (Signed)
 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 07/30/2023: establish care with Dr. Rosaline Coma  08/24/2023: Bone marrow biopsy shows 5% plasma cells, consistent with MGUS.  Interval History:  Tina Mendez 78 y.o. female with medical history significant for IgA Kappa MGUS presents for a follow up visit. The patient's last visit was on 07/30/2023 at which time she established care. In the interim since the last visit she underwent bone survey, 24-hour UPEP, and bone marrow biopsy which support the diagnosis of MGUS.  On exam today Tina Mendez reports that the bone marrow biopsy procedure went well.  She does not have any major pain or soreness or bruising.  She reports nothing out of the ordinary.  She reports that she did stop her Fosamax  because was causing her to wake up and urinate at night.  She notes was not a common side effect of the medication, but when she discontinued it it did improve.  She also has been seen at [facility] for her basal cell carcinoma and is set up with a Mohs surgeon on 11/05/2023.  She notes otherwise she has been quite well with no recent fevers, chills, sweats, nausea, vomiting or diarrhea.  A full 10 point ROS is otherwise negative.  MEDICAL HISTORY:  Past Medical History:  Diagnosis Date   Abdominal pain 05/2018   right lower abd pain/one time   Allergy    pollen   Basal cell carcinoma of skin    mainly on face   Cancer (HCC) 2002   L BREAST/had mastectomy   Colon polyp    Hemorrhoids    hx of    History of anal fissures    Osteoporosis    Plantar  fasciitis     SURGICAL HISTORY: Past Surgical History:  Procedure Laterality Date   Basal Cell Lesion  2018   removed from nose/ at [facility] hospital   Breast CA signs - Chemo  06/2000   no radiation   BREAST CYST EXCISION  1970   rt   CESAREAN SECTION  1980   1 time   COLONOSCOPY  2014   COLONOSCOPY  08/2020   Dr Elvin Hammer   IR BONE MARROW BIOPSY & ASPIRATION  08/24/2023   MASTECTOMY  2002   LEFT    SOCIAL HISTORY: Social History   Socioeconomic History   Marital status: Married    Spouse name: Not on file   Number of children: Not on file   Years of education: Not on file   Highest education level: Bachelor's degree (e.g., BA, AB, BS)  Occupational History   Occupation: [employer]    Employer: [employer]  Tobacco Use   Smoking status: Never   Smokeless tobacco: Never  Vaping Use   Vaping status: Never Used  Substance and Sexual Activity   Alcohol use: No   Drug use: No   Sexual activity: Yes  Other Topics Concern   Not on file  Social History Narrative   Regular exercise:  walks daily, does yard work/gardening   Left handed    Lives at home with husband    Caffeine: 1 cup in the  morning   Social Drivers of Health   Financial Resource Strain: Low Risk  (06/13/2023)   Overall Financial Resource Strain (CARDIA)    Difficulty of Paying Living Expenses: Not hard at all  Food Insecurity: No Food Insecurity (06/13/2023)   Hunger Vital Sign    Worried About Running Out of Food in the Last Year: Never true    Ran Out of Food in the Last Year: Never true  Transportation Needs: No Transportation Needs (06/13/2023)   PRAPARE - Administrator, Civil Service (Medical): No    Lack of Transportation (Non-Medical): No  Physical Activity: Sufficiently Active (06/13/2023)   Exercise Vital Sign    Days of Exercise per Week: 5 days    Minutes of Exercise per Session: 30 min  Stress: No Stress Concern Present (06/13/2023)   [employer] of  Occupational Health - Occupational Stress Questionnaire    Feeling of Stress : Not at all  Social Connections: Socially Integrated (06/13/2023)   Social Connection and Isolation Panel [NHANES]    Frequency of Communication with Friends and Family: Three times a week    Frequency of Social Gatherings with Friends and Family: Three times a week    Attends Religious Services: More than 4 times per year    Active Member of Clubs or Organizations: Yes    Attends Banker Meetings: More than 4 times per year    Marital Status: Married  Catering manager Violence: Not At Risk (02/04/2023)   Humiliation, Afraid, Rape, and Kick questionnaire    Fear of Current or Ex-Partner: No    Emotionally Abused: No    Physically Abused: No    Sexually Abused: No    FAMILY HISTORY: Family History  Problem Relation Age of Onset   Diabetes Mother    Heart disease Mother    Heart failure Mother    Varicose Veins Mother    Colon polyps Father    Cancer Paternal Aunt        breast   Cancer Paternal Uncle        Lung CA, smoker   Colon cancer Neg Hx    Breast cancer Neg Hx    Esophageal cancer Neg Hx    Rectal cancer Neg Hx    Stomach cancer Neg Hx    Neuropathy Neg Hx    Neural tube defect Neg Hx    Neurologic Disorder Neg Hx     ALLERGIES:  is allergic to sulfonamide derivatives.  MEDICATIONS:  Current Outpatient Medications  Medication Sig Dispense Refill   alendronate  (FOSAMAX ) 70 MG tablet Take 1 tablet (70 mg total) by mouth every 7 (seven) days. Take with a full glass of water on an empty stomach. 12 tablet 3   Calcium-Magnesium-Vitamin D  (CALCIUM 1200+D3 PO) Take 1 capsule by mouth daily. Vitamin D3 800 IU     cyanocobalamin  (VITAMIN B12) 500 MCG tablet Take 500 mcg by mouth daily.     No current facility-administered medications for this visit.    REVIEW OF SYSTEMS:   Constitutional: ( - ) fevers, ( - )  chills , ( - ) night sweats Eyes: ( - ) blurriness of vision, ( - )  double vision, ( - ) watery eyes Ears, nose, mouth, throat, and face: ( - ) mucositis, ( - ) sore throat Respiratory: ( - ) cough, ( - ) dyspnea, ( - ) wheezes Cardiovascular: ( - ) palpitation, ( - ) chest discomfort, ( - ) lower extremity  swelling Gastrointestinal:  ( - ) nausea, ( - ) heartburn, ( - ) change in bowel habits Skin: ( - ) abnormal skin rashes Lymphatics: ( - ) new lymphadenopathy, ( - ) easy bruising Neurological: ( - ) numbness, ( - ) tingling, ( - ) new weaknesses Behavioral/Psych: ( - ) mood change, ( - ) new changes  All other systems were reviewed with the patient and are negative.  PHYSICAL EXAMINATION:  Vitals:   09/03/23 0837  BP: 134/70  Pulse: 84  Resp: 16  Temp: 97.6 F (36.4 C)  SpO2: 100%   Filed Weights   09/03/23 0837  Weight: 129 lb 9.6 oz (58.8 kg)    GENERAL: alert, no distress and comfortable SKIN: skin color, texture, turgor are normal, no rashes or significant lesions EYES: conjunctiva are pink and non-injected, sclera clear OROPHARYNX: no exudate, no erythema; lips, buccal mucosa, and tongue normal  NECK: supple, non-tender LYMPH:  no palpable lymphadenopathy in the cervical, axillary or inguinal LUNGS: clear to auscultation and percussion with normal breathing effort HEART: regular rate & rhythm and no murmurs and no lower extremity edema ABDOMEN: soft, non-tender, non-distended, normal bowel sounds Musculoskeletal: no cyanosis of digits and no clubbing  PSYCH: alert & oriented x 3, fluent speech NEURO: no focal motor/sensory deficits  LABORATORY DATA:  I have reviewed the data as listed    Latest Ref Rng & Units 09/03/2023    8:15 AM 08/24/2023    7:43 AM 07/30/2023    2:43 PM  CBC  WBC 4.0 - 10.5 K/uL 4.0  5.0  5.8   Hemoglobin 12.0 - 15.0 g/dL 60.6  30.1  60.1   Hematocrit 36.0 - 46.0 % 42.8  42.3  40.3   Platelets 150 - 400 K/uL 193  182  188        Latest Ref Rng & Units 09/03/2023    8:15 AM 07/30/2023    2:43 PM  06/17/2023    2:03 PM  CMP  Glucose 70 - 99 mg/dL 093  86    BUN 8 - 23 mg/dL 15  17    Creatinine 2.35 - 1.00 mg/dL 5.73  2.20    Sodium 254 - 145 mmol/L 141  140    Potassium 3.5 - 5.1 mmol/L 4.3  4.4    Chloride 98 - 111 mmol/L 107  107    CO2 22 - 32 mmol/L 28  29    Calcium 8.9 - 10.3 mg/dL 9.8  9.8    Total Protein 6.5 - 8.1 g/dL 7.2  7.2  6.6   Total Bilirubin 0.0 - 1.2 mg/dL 1.1  0.9    Alkaline Phos 38 - 126 U/L 61  69    AST 15 - 41 U/L 18  17    ALT 0 - 44 U/L 12  12      Lab Results  Component Value Date   MPROTEIN Not Observed 07/30/2023   MPROTEIN Not Observed 06/17/2023   Lab Results  Component Value Date   KPAFRELGTCHN 47.6 (H) 07/30/2023   LAMBDASER 9.4 07/30/2023   KAPLAMBRATIO >4.33 08/03/2023   KAPLAMBRATIO 5.06 (H) 07/30/2023    RADIOGRAPHIC STUDIES: I have personally reviewed the radiological images as listed and agreed with the findings in the report. IR BONE MARROW BIOPSY & ASPIRATION Result Date: 08/24/2023 INDICATION: 78 year old female referred for bone marrow biopsy EXAM: IR BONE MARRO BIOPY AND ASPIRATION MEDICATIONS: None. ANESTHESIA/SEDATION: Moderate (conscious) sedation was employed during this procedure. A total of  Versed  1.0 mg and Fentanyl  50 mcg was administered intravenously. Moderate Sedation Time: minutes. The patient's level of consciousness and vital signs were monitored continuously by radiology nursing throughout the procedure under my direct supervision. FLUOROSCOPY TIME:  Fluoroscopy Time: (2 mGy). COMPLICATIONS: None PROCEDURE: Informed written consent was obtained from the patient after a thorough discussion of the procedural risks, benefits and alternatives. All questions were addressed. Maximal Sterile Barrier Technique was utilized including caps, mask, sterile gowns, sterile gloves, sterile drape, hand hygiene and skin antiseptic. A timeout was performed prior to the initiation of the procedure. The procedure risks, benefits, and  alternatives were explained to the patient. Questions regarding the procedure were encouraged and answered. The patient understands and consents to the procedure. Patient was positioned prone under the image intensifier. Physical exam was used to determine the L5- S1 level, and then the posterior pelvis was prepped with Chlorhexidine in a sterile fashion. A sterile drape was applied covering the operative field. Sterile gown/sterile gloves were used for the procedure. Local anesthesia was provided with 1% Lidocaine . Posterior left iliac bone was targeted for biopsy. The skin and subcutaneous tissues were infiltrated with 1% lidocaine  without epinephrine. A small stab incision was made with an 11 blade scalpel, and an 11 gauge Murphy needle was advanced with fluoro guidance to the posterior cortex. Manual forced was used to advance the needle through the posterior cortex and the stylet was removed. A bone marrow aspirate was retrieved . The Murphy needle was then advanced without the stylet for a core biopsy. The core biopsy was retrieved . Manual pressure was used for hemostasis and a sterile dressing was placed. No complications were encountered no significant blood loss was encountered. Patient tolerated the procedure well and remained hemodynamically stable throughout. IMPRESSION: Status post image guided bone marrow biopsy. Signed, Marciano Settles. Rexine Cater, RPVI [facility] Electronically Signed   By: Myrlene Asper D.O.   On: 08/24/2023 09:00   DG Bone Survey Met Result Date: 08/12/2023 CLINICAL DATA:  MGUS EXAM: METASTATIC BONE SURVEY COMPARISON:  None Available. FINDINGS: Metastatic bone survey was performed. Frontal view the chest demonstrates lungs to be clear. Cardiac shadow is within normal limits. No rib abnormality is seen. Degenerative changes of the cervical spine are noted. No lytic or sclerotic lesions are seen. No lytic or sclerotic  lesions are noted within the skull. Upper extremities show no lytic or sclerotic lesions. Mild degenerative changes of the thoracic and lumbar spines are seen. No compression deformity is noted. No lytic or sclerotic lesions are seen. The pelvic bony structures are within normal limits. Bilateral lower extremities show no lytic or sclerotic lesions. IMPRESSION: No lytic or sclerotic lesions are noted. Scattered degenerative changes are noted. Electronically Signed   By: Violeta Grey M.D.   On: 08/12/2023 19:40    ASSESSMENT & PLAN Tina Mendez 78 y.o. female with medical history significant for IgA Kappa MGUS presents for a follow up visit.  After review of the labs, review of the records, and discussion with the patient the patients findings are most consistent with an IgA kappa monoclonal gammopathy.    Monoclonal Gammopathies are a group of medical conditions defined by the presence of a monoclonal protein (an M protein) in the blood or urine. Monoclonal gammopathies include monoclonal gammopathy of unknown significance (MGUS), Monoclonal gammopathies of renal or neurological significance,  smoldering multiple myeloma (SMM), multiple myeloma (MM), AL amyloidosis, and Waldenstrom macroglobulinemia. The goal of the  initial workup is to determine which monoclonal gammopathy a patient has. The workup consists of evaluating protein in the serum (with serum protein electrophoresis (SPEP) and serum free light chains) , evaluating protein in the urine (UPEP), and evaluation of the skeleton (DG Bone Met Survey) to assure no lytic lesions. Baseline bloodwork includes CMP and CBC. If no CRAB criteria or high risk criteria are noted then the diagnosis is MGUS. MGUS must be followed with bloodwork periodically to assure it does not convert to multiple myeloma (occurs to approximately 1% of patients per year). If there are CRAB criteria or high risk features (such as elevated serum free light chain ratio (taking  into account renal function), a non IgG M protein, or M protein >1.5) then a bone marrow biopsy must be pursued.     #IgA Kappa Monoclonal Gammopathy of Undetermined Significance --Bone marrow biopsy on 08/24/2023 showed 5% plasma cells, consistent with MGUS. --Metastatic survey on 08/04/2023 showed no evidence of lytic lesions.  Recommend repeating every 1 to 2 years. --Will order SPEP and serum free light chains at each visit. --Labs today show white blood cell count 4.0, hemoglobin 14.4, MCV 94.5, platelets 193 --RTC in 6 months for clinic visit, if stable at that time can extend to every 12 months.  No orders of the defined types were placed in this encounter.   All questions were answered. The patient knows to call the clinic with any problems, questions or concerns.  A total of more than 30 minutes were spent on this encounter with face-to-face time and non-face-to-face time, including preparing to see the patient, ordering tests and/or medications, counseling the patient and coordination of care as outlined above.   Rogerio Clay, MD Department of Hematology/Oncology [facility] Phone: [phone] Pager: [phone] Email: [email or web address]  09/03/2023 9:33 AM

## 2023-09-22 DIAGNOSIS — C44712 Basal cell carcinoma of skin of right lower limb, including hip: Secondary | ICD-10-CM | POA: Diagnosis not present

## 2023-09-22 DIAGNOSIS — C44719 Basal cell carcinoma of skin of left lower limb, including hip: Secondary | ICD-10-CM | POA: Diagnosis not present

## 2023-11-05 DIAGNOSIS — C44311 Basal cell carcinoma of skin of nose: Secondary | ICD-10-CM | POA: Diagnosis not present

## 2023-12-08 DIAGNOSIS — C44311 Basal cell carcinoma of skin of nose: Secondary | ICD-10-CM | POA: Diagnosis not present

## 2023-12-21 DIAGNOSIS — Z4889 Encounter for other specified surgical aftercare: Secondary | ICD-10-CM | POA: Diagnosis not present

## 2023-12-21 DIAGNOSIS — L565 Disseminated superficial actinic porokeratosis (DSAP): Secondary | ICD-10-CM | POA: Diagnosis not present

## 2023-12-21 DIAGNOSIS — Z85828 Personal history of other malignant neoplasm of skin: Secondary | ICD-10-CM | POA: Diagnosis not present

## 2023-12-21 DIAGNOSIS — D2270 Melanocytic nevi of unspecified lower limb, including hip: Secondary | ICD-10-CM | POA: Diagnosis not present

## 2023-12-21 DIAGNOSIS — Z1283 Encounter for screening for malignant neoplasm of skin: Secondary | ICD-10-CM | POA: Diagnosis not present

## 2023-12-21 DIAGNOSIS — L570 Actinic keratosis: Secondary | ICD-10-CM | POA: Diagnosis not present

## 2023-12-21 DIAGNOSIS — D485 Neoplasm of uncertain behavior of skin: Secondary | ICD-10-CM | POA: Diagnosis not present

## 2023-12-21 DIAGNOSIS — L821 Other seborrheic keratosis: Secondary | ICD-10-CM | POA: Diagnosis not present

## 2023-12-21 DIAGNOSIS — L84 Corns and callosities: Secondary | ICD-10-CM | POA: Diagnosis not present

## 2023-12-21 DIAGNOSIS — L814 Other melanin hyperpigmentation: Secondary | ICD-10-CM | POA: Diagnosis not present

## 2023-12-21 DIAGNOSIS — D2260 Melanocytic nevi of unspecified upper limb, including shoulder: Secondary | ICD-10-CM | POA: Diagnosis not present

## 2023-12-21 DIAGNOSIS — L578 Other skin changes due to chronic exposure to nonionizing radiation: Secondary | ICD-10-CM | POA: Diagnosis not present

## 2023-12-21 DIAGNOSIS — D492 Neoplasm of unspecified behavior of bone, soft tissue, and skin: Secondary | ICD-10-CM | POA: Diagnosis not present

## 2023-12-21 DIAGNOSIS — D225 Melanocytic nevi of trunk: Secondary | ICD-10-CM | POA: Diagnosis not present

## 2024-02-07 ENCOUNTER — Telehealth: Payer: Self-pay | Admitting: Family Medicine

## 2024-02-07 DIAGNOSIS — E538 Deficiency of other specified B group vitamins: Secondary | ICD-10-CM

## 2024-02-07 DIAGNOSIS — M810 Age-related osteoporosis without current pathological fracture: Secondary | ICD-10-CM

## 2024-02-07 DIAGNOSIS — E559 Vitamin D deficiency, unspecified: Secondary | ICD-10-CM

## 2024-02-07 DIAGNOSIS — E7800 Pure hypercholesterolemia, unspecified: Secondary | ICD-10-CM

## 2024-02-07 NOTE — Telephone Encounter (Signed)
-----   Message from [name] Tina Mendez sent at 02/01/2024  3:54 PM EDT ----- Regarding: Lab orders for Wed, 11.5.25 Patient is scheduled for CPX labs, please order future labs, Thanks , [name]

## 2024-02-08 ENCOUNTER — Encounter: Payer: Self-pay | Admitting: Podiatry

## 2024-02-08 ENCOUNTER — Ambulatory Visit (INDEPENDENT_AMBULATORY_CARE_PROVIDER_SITE_OTHER): Admitting: Podiatry

## 2024-02-08 ENCOUNTER — Ambulatory Visit (INDEPENDENT_AMBULATORY_CARE_PROVIDER_SITE_OTHER)

## 2024-02-08 DIAGNOSIS — M722 Plantar fascial fibromatosis: Secondary | ICD-10-CM

## 2024-02-08 DIAGNOSIS — M7662 Achilles tendinitis, left leg: Secondary | ICD-10-CM

## 2024-02-08 MED ORDER — DEXAMETHASONE SODIUM PHOSPHATE 120 MG/30ML IJ SOLN
2.0000 mg | Freq: Once | INTRAMUSCULAR | Status: AC
  Administered 2024-02-08: 2 mg via INTRA_ARTICULAR

## 2024-02-08 NOTE — Progress Notes (Signed)
 She presents today chief complaint of pain in her left heel.  States that has been going on now for many months she has had a history of plantar fasciitis in the same foot years ago.  States that she has had shoes that rub her in the wrong place as she picked those up from the shoe market in [place] as she was referring to a pair of [employer].  I have reviewed her past medical history medications allergies surgeries and social history.  Objective: Vital signs are stable alert oriented x 3.  Pulses are palpable.  Neurologic sensorium is intact per Semmes Weinstein monofilament bilateral.  Deep tendon reflexes are intact muscle strength is normal and symmetrical bilaterally.  She does have a warm knot nonpulsatile nodule on the posterior aspect of her left heel which is exquisitely painful.  Radiographs taken today demonstrate an osseously mature individual no retrocalcaneal heel spur is noted.  Mild demineralization of the bone is present.  Soft tissue swelling overlying the posterior aspect of the calcaneus.  Assessment: Retro-Achilles tendinitis.  Achilles bursitis left.  Plan: Discussed etiology pathology conservative versus surgical therapies.  At this point we injected the bursa today with dexamethasone 2 mg.  And I will follow-up with her as needed.

## 2024-02-09 ENCOUNTER — Other Ambulatory Visit: Payer: Self-pay | Admitting: Hematology and Oncology

## 2024-02-09 DIAGNOSIS — D472 Monoclonal gammopathy: Secondary | ICD-10-CM

## 2024-02-10 ENCOUNTER — Inpatient Hospital Stay: Attending: Physician Assistant

## 2024-02-10 ENCOUNTER — Other Ambulatory Visit (INDEPENDENT_AMBULATORY_CARE_PROVIDER_SITE_OTHER): Payer: Medicare Other

## 2024-02-10 DIAGNOSIS — M810 Age-related osteoporosis without current pathological fracture: Secondary | ICD-10-CM | POA: Diagnosis not present

## 2024-02-10 DIAGNOSIS — E7800 Pure hypercholesterolemia, unspecified: Secondary | ICD-10-CM | POA: Diagnosis not present

## 2024-02-10 DIAGNOSIS — D472 Monoclonal gammopathy: Secondary | ICD-10-CM | POA: Insufficient documentation

## 2024-02-10 DIAGNOSIS — E559 Vitamin D deficiency, unspecified: Secondary | ICD-10-CM

## 2024-02-10 DIAGNOSIS — E538 Deficiency of other specified B group vitamins: Secondary | ICD-10-CM | POA: Diagnosis not present

## 2024-02-10 LAB — CBC WITH DIFFERENTIAL (CANCER CENTER ONLY)
Abs Immature Granulocytes: 0.01 K/uL (ref 0.00–0.07)
Basophils Absolute: 0.1 K/uL (ref 0.0–0.1)
Basophils Relative: 1 %
Eosinophils Absolute: 0.1 K/uL (ref 0.0–0.5)
Eosinophils Relative: 2 %
HCT: 42.9 % (ref 36.0–46.0)
Hemoglobin: 14.5 g/dL (ref 12.0–15.0)
Immature Granulocytes: 0 %
Lymphocytes Relative: 27 %
Lymphs Abs: 1.4 K/uL (ref 0.7–4.0)
MCH: 31.8 pg (ref 26.0–34.0)
MCHC: 33.8 g/dL (ref 30.0–36.0)
MCV: 94.1 fL (ref 80.0–100.0)
Monocytes Absolute: 0.3 K/uL (ref 0.1–1.0)
Monocytes Relative: 6 %
Neutro Abs: 3.4 K/uL (ref 1.7–7.7)
Neutrophils Relative %: 64 %
Platelet Count: 179 K/uL (ref 150–400)
RBC: 4.56 MIL/uL (ref 3.87–5.11)
RDW: 12.4 % (ref 11.5–15.5)
WBC Count: 5.4 K/uL (ref 4.0–10.5)
nRBC: 0 % (ref 0.0–0.2)

## 2024-02-10 LAB — COMPREHENSIVE METABOLIC PANEL WITH GFR
ALT: 14 U/L (ref 0–35)
AST: 18 U/L (ref 0–37)
Albumin: 4.4 g/dL (ref 3.5–5.2)
Alkaline Phosphatase: 59 U/L (ref 39–117)
BUN: 21 mg/dL (ref 6–23)
CO2: 29 meq/L (ref 19–32)
Calcium: 9.7 mg/dL (ref 8.4–10.5)
Chloride: 101 meq/L (ref 96–112)
Creatinine, Ser: 0.75 mg/dL (ref 0.40–1.20)
GFR: 76.8 mL/min (ref >60.00)
Glucose, Bld: 87 mg/dL (ref 70–99)
Potassium: 4.4 meq/L (ref 3.5–5.1)
Sodium: 138 meq/L (ref 135–145)
Total Bilirubin: 1 mg/dL (ref 0.2–1.2)
Total Protein: 6.6 g/dL (ref 6.0–8.3)

## 2024-02-10 LAB — CMP (CANCER CENTER ONLY)
ALT: 13 U/L (ref 0–44)
AST: 20 U/L (ref 15–41)
Albumin: 4.5 g/dL (ref 3.5–5.0)
Alkaline Phosphatase: 63 U/L (ref 38–126)
Anion gap: 6 (ref 5–15)
BUN: 20 mg/dL (ref 8–23)
CO2: 28 mmol/L (ref 22–32)
Calcium: 9.9 mg/dL (ref 8.9–10.3)
Chloride: 104 mmol/L (ref 98–111)
Creatinine: 0.74 mg/dL (ref 0.44–1.00)
GFR, Estimated: >60 mL/min (ref >60)
Glucose, Bld: 95 mg/dL (ref 70–99)
Potassium: 4.3 mmol/L (ref 3.5–5.1)
Sodium: 138 mmol/L (ref 135–145)
Total Bilirubin: 1 mg/dL (ref 0.0–1.2)
Total Protein: 7.4 g/dL (ref 6.5–8.1)

## 2024-02-10 LAB — CBC WITH DIFFERENTIAL/PLATELET
Basophils Absolute: 0 K/uL (ref 0.0–0.1)
Basophils Relative: 0.9 % (ref 0.0–3.0)
Eosinophils Absolute: 0.1 K/uL (ref 0.0–0.7)
Eosinophils Relative: 2 % (ref 0.0–5.0)
HCT: 42 % (ref 36.0–46.0)
Hemoglobin: 14 g/dL (ref 12.0–15.0)
Lymphocytes Relative: 29 % (ref 12.0–46.0)
Lymphs Abs: 1.2 K/uL (ref 0.7–4.0)
MCHC: 33.4 g/dL (ref 30.0–36.0)
MCV: 94.7 fl (ref 78.0–100.0)
Monocytes Absolute: 0.3 K/uL (ref 0.1–1.0)
Monocytes Relative: 7.4 % (ref 3.0–12.0)
Neutro Abs: 2.6 K/uL (ref 1.4–7.7)
Neutrophils Relative %: 60.7 % (ref 43.0–77.0)
Platelets: 162 K/uL (ref 150.0–400.0)
RBC: 4.43 Mil/uL (ref 3.87–5.11)
RDW: 13.2 % (ref 11.5–15.5)
WBC: 4.3 K/uL (ref 4.0–10.5)

## 2024-02-10 LAB — LACTATE DEHYDROGENASE: LDH: 170 U/L (ref 98–192)

## 2024-02-10 LAB — LIPID PANEL
Cholesterol: 245 mg/dL — ABNORMAL HIGH (ref 0–200)
HDL: 82.2 mg/dL (ref >39.00)
LDL Cholesterol: 144 mg/dL — ABNORMAL HIGH (ref 0–99)
NonHDL: 162.66
Total CHOL/HDL Ratio: 3
Triglycerides: 92 mg/dL (ref 0.0–149.0)
VLDL: 18.4 mg/dL (ref 0.0–40.0)

## 2024-02-11 ENCOUNTER — Ambulatory Visit: Payer: Self-pay | Admitting: Family Medicine

## 2024-02-11 LAB — TSH: TSH: 2.37 u[IU]/mL (ref 0.35–5.50)

## 2024-02-11 LAB — VITAMIN B12: Vitamin B-12: 461 pg/mL (ref 211–911)

## 2024-02-11 LAB — KAPPA/LAMBDA LIGHT CHAINS
Kappa free light chain: 46.9 mg/L — ABNORMAL HIGH (ref 3.3–19.4)
Kappa, lambda light chain ratio: 4.84 — ABNORMAL HIGH (ref 0.26–1.65)
Lambda free light chains: 9.7 mg/L (ref 5.7–26.3)

## 2024-02-11 LAB — VITAMIN D 25 HYDROXY (VIT D DEFICIENCY, FRACTURES): VITD: 47.45 ng/mL (ref 30.00–100.00)

## 2024-02-15 LAB — MULTIPLE MYELOMA PANEL, SERUM
Albumin SerPl Elph-Mcnc: 4 g/dL (ref 2.9–4.4)
Albumin/Glob SerPl: 1.6 (ref 0.7–1.7)
Alpha 1: 0.2 g/dL (ref 0.0–0.4)
Alpha2 Glob SerPl Elph-Mcnc: 0.7 g/dL (ref 0.4–1.0)
B-Globulin SerPl Elph-Mcnc: 0.9 g/dL (ref 0.7–1.3)
Gamma Glob SerPl Elph-Mcnc: 0.9 g/dL (ref 0.4–1.8)
Globulin, Total: 2.6 g/dL (ref 2.2–3.9)
IgA: 255 mg/dL (ref 64–422)
IgG (Immunoglobin G), Serum: 868 mg/dL (ref 586–1602)
IgM (Immunoglobulin M), Srm: 90 mg/dL (ref 26–217)
M Protein SerPl Elph-Mcnc: 0.2 g/dL — ABNORMAL HIGH
Total Protein ELP: 6.6 g/dL (ref 6.0–8.5)

## 2024-02-17 ENCOUNTER — Ambulatory Visit: Admitting: Physician Assistant

## 2024-02-17 ENCOUNTER — Inpatient Hospital Stay (HOSPITAL_BASED_OUTPATIENT_CLINIC_OR_DEPARTMENT_OTHER): Admitting: Physician Assistant

## 2024-02-17 ENCOUNTER — Ambulatory Visit (INDEPENDENT_AMBULATORY_CARE_PROVIDER_SITE_OTHER): Payer: Medicare Other | Admitting: Family Medicine

## 2024-02-17 ENCOUNTER — Encounter: Payer: Self-pay | Admitting: Family Medicine

## 2024-02-17 VITALS — BP 124/69 | HR 69 | Temp 97.9°F | Resp 16 | Ht 61.25 in | Wt 136.7 lb

## 2024-02-17 VITALS — BP 130/70 | HR 75 | Temp 98.3°F | Ht 61.25 in | Wt 135.1 lb

## 2024-02-17 DIAGNOSIS — E538 Deficiency of other specified B group vitamins: Secondary | ICD-10-CM

## 2024-02-17 DIAGNOSIS — E7800 Pure hypercholesterolemia, unspecified: Secondary | ICD-10-CM | POA: Diagnosis not present

## 2024-02-17 DIAGNOSIS — D472 Monoclonal gammopathy: Secondary | ICD-10-CM | POA: Diagnosis not present

## 2024-02-17 DIAGNOSIS — E559 Vitamin D deficiency, unspecified: Secondary | ICD-10-CM

## 2024-02-17 DIAGNOSIS — M810 Age-related osteoporosis without current pathological fracture: Secondary | ICD-10-CM

## 2024-02-17 DIAGNOSIS — C44311 Basal cell carcinoma of skin of nose: Secondary | ICD-10-CM | POA: Diagnosis not present

## 2024-02-17 NOTE — Patient Instructions (Addendum)
 If you are interested in the new shingles vaccine (Shingrix) - call your local pharmacy to check on coverage and availability (when you are ready)   Stay active An exercise bike may be easier on your foot while it heals Add some strength training to your routine, this is important for bone and brain health and can reduce your risk of falls and help your body use insulin properly and regulate weight  Light weights, exercise bands , and internet videos are a good way to start  Yoga (chair or regular), machines , floor exercises or a gym with machines are also good options     For cholesterol Avoid red meat/ fried foods/ egg yolks/ fatty breakfast meats/ butter, cheese and high fat dairy/ and shellfish   For overall health  Avoid added sugars in your diet when you can  Try to get most of your carbohydrates from produce (with the exception of white potatoes) and whole grains Eat less bread/pasta/rice/snack foods/cereals/sweets and other items from the middle of the grocery store (processed carbs)  In the future if you want to see a sport med specialist to discuss osteoporosis -we will refer you   In the future if you are interested in the cardiac calcium score (read about it) -also let us  know    Let's re check cholesterol in about 6 months

## 2024-02-17 NOTE — Assessment & Plan Note (Signed)
 Lab Results  Component Value Date   VITAMINB12 461 02/10/2024   Continues current oral supplement

## 2024-02-17 NOTE — Assessment & Plan Note (Signed)
 Utd derm care Had moh's and healed from that  Uses sun protection

## 2024-02-17 NOTE — Progress Notes (Signed)
 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 07/30/2023: establish care with Dr. Federico  08/24/2023: Bone marrow biopsy shows 5% plasma cells, consistent with MGUS.  Interval History:  Tina Mendez 78 y.o. female presents for a follow up for MGUS.  On exam today Tina Mendez reports she is doing well without any new or concerning symptoms.  She did undergo similar constructive skin procedures over the summer and mohs surgery of her face for basal cell carcinoma. She reports her energy and appetite are unchanged. She can complete her ADLs on her own. She denies nausea, vomiting or bowel habit changes. She denies any new bone or back pain. She denies fevers, chills, sweats, shortness of breath, chest pain or cough. She has no other complaints. Rest of the ROS is below.   MEDICAL HISTORY:  Past Medical History:  Diagnosis Date   Abdominal pain 05/2018   right lower abd pain/one time   Allergy    pollen   Basal cell carcinoma of skin    mainly on face   Breast cancer (HCC) 2002   Cancer (HCC) 2002   L BREAST/had mastectomy   Colon polyp    Hemorrhoids    hx of    History of anal fissures    Osteoporosis 2020   Plantar fasciitis     SURGICAL HISTORY: Past Surgical History:  Procedure Laterality Date   Basal Cell Lesion  2018   removed from nose/ at [facility]   Breast CA signs - Chemo  06/2000   no radiation   BREAST CYST EXCISION  1970   rt   CESAREAN SECTION  1980   1 time   COLONOSCOPY  2014   COLONOSCOPY  08/2020   Dr abran   IR BONE MARROW BIOPSY & ASPIRATION  08/24/2023   MASTECTOMY  2002   LEFT     SOCIAL HISTORY: Social History   Socioeconomic History   Marital status: Married    Spouse name: Not on file   Number of children: Not on file   Years of education: Not on file   Highest education level: Bachelor's degree (e.g., BA, AB, BS)  Occupational History   Occupation: [employer]    Employer: [employer]  Tobacco Use   Smoking status: Never   Smokeless tobacco: Never  Vaping Use   Vaping status: Never Used  Substance and Sexual Activity   Alcohol use: No   Drug use: No   Sexual activity: Not Currently  Other Topics Concern   Not on file  Social History Narrative   Regular exercise:  walks daily, does yard work/gardening   Left handed    Lives at home with husband    Caffeine: 1 cup in the morning   Social Drivers of Health   Financial Resource Strain: Low Risk  (02/16/2024)   Overall Financial Resource Strain (CARDIA)    Difficulty of Paying Living Expenses: Not hard at all  Food Insecurity: No Food Insecurity (02/16/2024)   Hunger Vital Sign    Worried About Running Out of Food in the Last Year: Never true    Ran Out of Food in the Last Year: Never true  Transportation Needs: No Transportation Needs (02/16/2024)   PRAPARE - Administrator, Civil Service (Medical): No    Lack of Transportation (Non-Medical): No  Physical Activity: Insufficiently Active (02/16/2024)   Exercise Vital Sign    Days of Exercise per Week: 5 days    Minutes of Exercise per Session: 20 min  Stress: No Stress Concern Present (02/16/2024)   [employer] of Occupational Health - Occupational Stress Questionnaire    Feeling of Stress: Only a little  Social Connections: Socially Integrated (02/16/2024)   Social Connection and Isolation Panel    Frequency of Communication with Friends and Family: More than three times a week    Frequency of Social Gatherings with Friends and Family: Twice a week    Attends Religious Services: More than 4 times  per year    Active Member of [employer] or Organizations: Yes    Attends Engineer, Structural: More than 4 times per year    Marital Status: Married  Catering Manager Violence: Not At Risk (02/04/2023)   Humiliation, Afraid, Rape, and Kick questionnaire    Fear of Current or Ex-Partner: No    Emotionally Abused: No    Physically Abused: No    Sexually Abused: No    FAMILY HISTORY: Family History  Problem Relation Age of Onset   Diabetes Mother    Heart disease Mother    Heart failure Mother    Varicose Veins Mother    Colon polyps Father    Cancer Paternal Aunt        breast   Cancer Paternal Uncle        Lung CA, smoker   Colon cancer Neg Hx    Breast cancer Neg Hx    Esophageal cancer Neg Hx    Rectal cancer Neg Hx    Stomach cancer Neg Hx    Neuropathy Neg Hx    Neural tube defect Neg Hx    Neurologic Disorder Neg Hx     ALLERGIES:  is allergic to alendronate  and sulfonamide derivatives.  MEDICATIONS:  Current Outpatient Medications  Medication Sig Dispense Refill   Calcium-Magnesium-Vitamin D  (CALCIUM 1200+D3 PO) Take 1 capsule by mouth daily. Has 800 iu of D3     Cholecalciferol  (D3) 25 MCG (1000 UT) capsule Take 1,000 Units by mouth daily.     cyanocobalamin  (VITAMIN B12) 500 MCG tablet Take 500 mcg by mouth daily.     No current facility-administered medications for this visit.    REVIEW OF SYSTEMS:   Constitutional: ( - ) fevers, ( - )  chills , ( - ) night sweats Eyes: ( - ) blurriness of vision, ( - ) double vision, ( - ) watery eyes Ears, nose, mouth, throat, and face: ( - ) mucositis, ( - ) sore throat Respiratory: ( - ) cough, ( - ) dyspnea, ( - ) wheezes Cardiovascular: ( - ) palpitation, ( - ) chest discomfort, ( - ) lower extremity swelling Gastrointestinal:  ( - ) nausea, ( - ) heartburn, ( - ) change in bowel habits Skin: ( - ) abnormal skin rashes Lymphatics: ( - ) new lymphadenopathy, ( - ) easy bruising Neurological: ( - ) numbness, ( -  ) tingling, ( - ) new weaknesses Behavioral/Psych: ( - ) mood change, ( - ) new changes  All other systems were reviewed with the patient and are negative.  PHYSICAL EXAMINATION:  Vitals:   02/17/24 1126  BP: 124/69  Pulse: 69  Resp:  16  Temp: 97.9 F (36.6 C)  SpO2: 100%    Filed Weights   02/17/24 1126  Weight: 136 lb 11.2 oz (62 kg)     GENERAL: alert, no distress and comfortable SKIN: skin color, texture, turgor are normal, no rashes or significant lesions EYES: conjunctiva are pink and non-injected, sclera clear OROPHARYNX: no exudate, no erythema; lips, buccal mucosa, and tongue normal  LUNGS: clear to auscultation and percussion with normal breathing effort HEART: regular rate & rhythm and no murmurs and no lower extremity edema Musculoskeletal: no cyanosis of digits and no clubbing  PSYCH: alert & oriented x 3, fluent speech NEURO: no focal motor/sensory deficits  LABORATORY DATA:  I have reviewed the data as listed    Latest Ref Rng & Units 02/10/2024   10:20 AM 02/10/2024    9:15 AM 09/03/2023    8:15 AM  CBC  WBC 4.0 - 10.5 K/uL 5.4  4.3  4.0   Hemoglobin 12.0 - 15.0 g/dL 85.4  85.9  85.5   Hematocrit 36.0 - 46.0 % 42.9  42.0  42.8   Platelets 150 - 400 K/uL 179  162.0  193        Latest Ref Rng & Units 02/10/2024   10:20 AM 02/10/2024    9:15 AM 09/03/2023    8:15 AM  CMP  Glucose 70 - 99 mg/dL 95  87  892   BUN 8 - 23 mg/dL 20  21  15    Creatinine 0.44 - 1.00 mg/dL 9.25  9.24  9.22   Sodium 135 - 145 mmol/L 138  138  141   Potassium 3.5 - 5.1 mmol/L 4.3  4.4  4.3   Chloride 98 - 111 mmol/L 104  101  107   CO2 22 - 32 mmol/L 28  29  28    Calcium 8.9 - 10.3 mg/dL 9.9  9.7  9.8   Total Protein 6.5 - 8.1 g/dL 7.4  6.6  7.2   Total Bilirubin 0.0 - 1.2 mg/dL 1.0  1.0  1.1   Alkaline Phos 38 - 126 U/L 63  59  61   AST 15 - 41 U/L 20  18  18    ALT 0 - 44 U/L 13  14  12      Lab Results  Component Value Date   MPROTEIN 0.2 (H) 02/10/2024   MPROTEIN  Not Observed 07/30/2023   MPROTEIN Not Observed 06/17/2023   Lab Results  Component Value Date   KPAFRELGTCHN 46.9 (H) 02/10/2024   KPAFRELGTCHN 47.6 (H) 07/30/2023   LAMBDASER 9.7 02/10/2024   LAMBDASER 9.4 07/30/2023   KAPLAMBRATIO 4.84 (H) 02/10/2024   KAPLAMBRATIO >4.33 08/03/2023   KAPLAMBRATIO 5.06 (H) 07/30/2023    RADIOGRAPHIC STUDIES: I have personally reviewed the radiological images as listed and agreed with the findings in the report. DG Foot Complete Left Result Date: 02/08/2024 Please see detailed radiograph report in office note.   ASSESSMENT & PLAN Tina Mendez is a 78 y.o.  female with medical history significant for IgA Kappa MGUS presents for a follow up visit.   #IgA Kappa Monoclonal Gammopathy of Undetermined Significance --Bone marrow biopsy on 08/24/2023 showed 5% plasma cells, consistent with MGUS. --Labs from 02/10/2024 reviewed with patient. CBC showed no cytopenias. CMP showed no evidence of renal dysfunction and calcium levels were normal. SPEP detected M protein measuring 0.2 g/dL. Kappa light chain was stable at 46.9, lambda light chain 9.7 and ratio 4.84 --Metastatic survey on 08/04/2023 showed no evidence  of lytic lesions.  Next due April 2026 --24 hour UPEP from 08/03/2023 was unremarkable. Net due April 2026 --No evidence of transformation to multiple myeloma.  --RTC in 6 months for clinic visit with labs.   No orders of the defined types were placed in this encounter.   All questions were answered. The patient knows to call the clinic with any problems, questions or concerns.   I have spent a total of 25 minutes minutes of face-to-face and non-face-to-face time, preparing to see the patient, performing a medically appropriate examination, counseling and educating the patient,documenting clinical information in the electronic health record, independently interpreting results and communicating results to the patient, and care coordination.    Johnston Police PA-C Dept of Hematology and Oncology [facility] Phone: [phone]   02/17/2024 1:00 PM

## 2024-02-17 NOTE — Assessment & Plan Note (Signed)
 Last vitamin D  Lab Results  Component Value Date   VD25OH 47.45 02/10/2024   Vitamin D  level is therapeutic with current supplementation Disc importance of this to bone and overall health

## 2024-02-17 NOTE — Assessment & Plan Note (Signed)
 Disc goals for lipids and reasons to control them Rev last labs with pt Rev low sat fat diet in detail LDL up to 144 but HDL also up ASCVD risk 21%  Will work on diet/eating bacon frequently   Offered ref for cardiac ca score-pt will read handout and get back to is if interested

## 2024-02-17 NOTE — Assessment & Plan Note (Signed)
 Tried alendronate  Caused anxious mood and frequent urination  Declines medication currently   Discussed fall prevention, supplements and exercise for bone density  Offered ref to sport med to discuss options

## 2024-02-17 NOTE — Progress Notes (Signed)
 Subjective:    Patient ID: Tina Mendez, female    DOB: Jun 09, 1945, 78 y.o.   MRN: [id number]  HPI  Pt presents for annual follow up for chronic medical problems   Wt Readings from Last 3 Encounters:  02/17/24 136 lb 11.2 oz (62 kg)  02/17/24 135 lb 2 oz (61.3 kg)  09/03/23 129 lb 9.6 oz (58.8 kg)   25.32 kg/m  Vitals:   02/17/24 0919  BP: 130/70  Pulse: 75  Temp: 98.3 F (36.8 C)  SpO2: 100%    Immunization History  Administered Date(s) Administered   Influenza,inj,Quad PF,6+ Mos 12/02/2018   PFIZER(Purple Top)SARS-COV-2 Vaccination 06/06/2019   Pneumococcal Conjugate-13 06/01/2015   Pneumococcal Polysaccharide-23 05/29/2014   Td 01/27/2003, 11/17/2012, 06/18/2023    Health Maintenance Due  Topic Date Due   Medicare Annual Wellness (AWV)  02/09/2024   Due for amw  Feeling pretty good overall  Rough year  Is taking care of herself   Seeing Dr Verta for achilles bursitis in heel  Had a shot  Changed to new balance shoes    Declines flu shot  Shingrix - may consider in the future   Did get tetanus updated    Mammogram 07/2023  Personal history of breast cancer  Left mastectomy  Self breast exam-no changes or lups   Gyn health No problems    Colon cancer screening  Colonoscopy 08/2020- 5 y recall   Bone health  Dexa  05/2023 Osteoporosis  Tried alendronate  for a month and did not tolerate it  (caused frequent urination)  Did not like it  Made her feel nervous also   Falls-none  Fractures-none  Supplements  ca and D  Last vitamin D  Lab Results  Component Value Date   VD25OH 47.45 02/10/2024    Exercise  Changed due to foot issue / so has not been able to walk     Derm care  Moh's for basal cell and did well   Mood    02/04/2023   11:01 AM 12/26/2022   12:07 PM 01/23/2022   11:41 AM 01/02/2021   11:07 AM 12/06/2019    2:10 PM  Depression screen PHQ 2/9  Decreased Interest 0 0 0 0 0  Down, Depressed, Hopeless 0 0 0 0 0   PHQ - 2 Score 0 0 0 0 0  Altered sleeping 0 0 0  0  Tired, decreased energy 0 0 0  0  Change in appetite 0 0 0  0  Feeling bad or failure about yourself  0 0 0  0  Trouble concentrating 0 0 0  0  Moving slowly or fidgety/restless 0 0   0  Suicidal thoughts 0 0 0  0  PHQ-9 Score 0  0  0   0   Difficult doing work/chores Not difficult at all Not difficult at all Not difficult at all  Not difficult at all     Data saved with a previous flowsheet row definition    B12 def Lab Results  Component Value Date   VITAMINB12 461 02/10/2024   Hyperlipidemia Lab Results  Component Value Date   CHOL 245 (H) 02/10/2024   CHOL 219 (H) 02/02/2023   CHOL 224 (H) 01/24/2022   Lab Results  Component Value Date   HDL 82.20 02/10/2024   HDL 74.50 02/02/2023   HDL 79.10 01/24/2022   Lab Results  Component Value Date   LDLCALC 144 (H) 02/10/2024   LDLCALC 127 (H) 02/02/2023  LDLCALC 134 (H) 01/24/2022   Lab Results  Component Value Date   TRIG 92.0 02/10/2024   TRIG 88.0 02/02/2023   TRIG 54.0 01/24/2022   Lab Results  Component Value Date   CHOLHDL 3 02/10/2024   CHOLHDL 3 02/02/2023   CHOLHDL 3 01/24/2022   Lab Results  Component Value Date   LDLDIRECT 145.7 05/20/2013   LDLDIRECT 153.9 11/11/2012   LDLDIRECT 152.9 09/29/2008    The 10-year ASCVD risk score (Arnett DK, et al., 2019) is: 19.4%   Values used to calculate the score:     Age: 31 years     Clincally relevant sex: Female     Is Non-Hispanic African American: No     Diabetic: No     Tobacco smoker: No     Systolic Blood Pressure: 124 mmHg     Is BP treated: No     HDL Cholesterol: 82.2 mg/dL     Total Cholesterol: 245 mg/dL  Diet is fair    Sees heme for MGUS-new  Onc for breast cancer   Lab Results  Component Value Date   WBC 5.4 02/10/2024   HGB 14.5 02/10/2024   HCT 42.9 02/10/2024   MCV 94.1 02/10/2024   PLT 179 02/10/2024     Patient Active Problem List   Diagnosis Date Noted   Leg  skin lesion, right 06/15/2023   Spider veins 02/09/2023   Small fiber neuropathy 12/07/2019   B12 deficiency 12/07/2019   Basal cell carcinoma 11/13/2016   Estrogen deficiency 06/06/2016   Routine general medical examination at a health care facility 05/24/2015   Vitamin D  deficiency 05/29/2014   Pure hypercholesterolemia 06/23/2008   Osteoporosis 06/16/2008   hx: breast cancer, left IDC, 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 + 06/16/2008   Past Medical History:  Diagnosis Date   Abdominal pain 05/2018   right lower abd pain/one time   Allergy    pollen   Basal cell carcinoma of skin    mainly on face   Breast cancer (HCC) 2002   Cancer (HCC) 2002   L BREAST/had mastectomy   Colon polyp    Hemorrhoids    hx of    History of anal fissures    Osteoporosis 2020   Plantar fasciitis    Past Surgical History:  Procedure Laterality Date   Basal Cell Lesion  2018   removed from nose/ at [facility]   Breast CA signs - Chemo  06/2000   no radiation   BREAST CYST EXCISION  1970   rt   CESAREAN SECTION  1980   1 time   COLONOSCOPY  2014   COLONOSCOPY  08/2020   Dr abran   IR BONE MARROW BIOPSY & ASPIRATION  08/24/2023   MASTECTOMY  2002   LEFT   Social History   Tobacco Use   Smoking status: Never   Smokeless tobacco: Never  Vaping Use   Vaping status: Never Used  Substance Use Topics   Alcohol use: No   Drug use: No   Family History  Problem Relation Age of Onset   Diabetes Mother    Heart disease Mother    Heart failure Mother    Varicose Veins Mother    Colon polyps Father    Cancer Paternal Aunt        breast   Cancer Paternal Uncle        Lung CA, smoker   Colon cancer Neg Hx    Breast cancer Neg Hx  Esophageal cancer Neg Hx    Rectal cancer Neg Hx    Stomach cancer Neg Hx    Neuropathy Neg Hx    Neural tube defect Neg Hx    Neurologic Disorder Neg Hx    Allergies  Allergen Reactions   Alendronate      Frequent urination Anxious mood   Sulfonamide Derivatives  Nausea Only   Current Outpatient Medications on File Prior to Visit  Medication Sig Dispense Refill   Calcium-Magnesium-Vitamin D  (CALCIUM 1200+D3 PO) Take 1 capsule by mouth daily. Has 800 iu of D3     Cholecalciferol  (D3) 25 MCG (1000 UT) capsule Take 1,000 Units by mouth daily.     cyanocobalamin  (VITAMIN B12) 500 MCG tablet Take 500 mcg by mouth daily.     No current facility-administered medications on file prior to visit.    Review of Systems  Constitutional:  Negative for activity change, appetite change, fatigue, fever and unexpected weight change.  HENT:  Negative for congestion, ear pain, rhinorrhea, sinus pressure and sore throat.   Eyes:  Negative for pain, redness and visual disturbance.  Respiratory:  Negative for cough, shortness of breath and wheezing.   Cardiovascular:  Negative for chest pain and palpitations.  Gastrointestinal:  Negative for abdominal pain, blood in stool, constipation and diarrhea.  Endocrine: Negative for polydipsia and polyuria.  Genitourinary:  Negative for dysuria, frequency and urgency.  Musculoskeletal:  Negative for arthralgias, back pain and myalgias.  Skin:  Negative for pallor and rash.  Allergic/Immunologic: Negative for environmental allergies.  Neurological:  Negative for dizziness, syncope and headaches.  Hematological:  Negative for adenopathy. Does not bruise/bleed easily.  Psychiatric/Behavioral:  Negative for decreased concentration and dysphoric mood. The patient is not nervous/anxious.        Objective:   Physical Exam Constitutional:      General: She is not in acute distress.    Appearance: Normal appearance. She is well-developed. She is not ill-appearing or diaphoretic.  HENT:     Head: Normocephalic and atraumatic.     Right Ear: Tympanic membrane, ear canal and external ear normal.     Left Ear: Tympanic membrane, ear canal and external ear normal.     Nose: Nose normal. No congestion.     Mouth/Throat:     Mouth:  Mucous membranes are moist.     Pharynx: Oropharynx is clear. No posterior oropharyngeal erythema.  Eyes:     General: No scleral icterus.    Extraocular Movements: Extraocular movements intact.     Conjunctiva/sclera: Conjunctivae normal.     Pupils: Pupils are equal, round, and reactive to light.  Neck:     Thyroid : No thyromegaly.     Vascular: No carotid bruit or JVD.  Cardiovascular:     Rate and Rhythm: Normal rate and regular rhythm.     Pulses: Normal pulses.     Heart sounds: Normal heart sounds.     No gallop.  Pulmonary:     Effort: Pulmonary effort is normal. No respiratory distress.     Breath sounds: Normal breath sounds. No wheezing.     Comments: Good air exch Chest:     Chest wall: No tenderness.  Abdominal:     General: Bowel sounds are normal. There is no distension or abdominal bruit.     Palpations: Abdomen is soft. There is no mass.     Tenderness: There is no abdominal tenderness.     Hernia: No hernia is present.  Genitourinary:  Comments: Right breast exam: No mass, nodules, thickening, tenderness, bulging, retraction, inflamation, nipple discharge or skin changes noted.  No axillary or clavicular LA.     Mastectomy site left /no masses or skin changes  Musculoskeletal:        General: No tenderness. Normal range of motion.     Cervical back: Normal range of motion and neck supple. No rigidity. No muscular tenderness.     Right lower leg: No edema.     Left lower leg: No edema.     Comments: No kyphosis   Lymphadenopathy:     Cervical: No cervical adenopathy.  Skin:    General: Skin is warm and dry.     Coloration: Skin is not pale.     Findings: No erythema or rash.     Comments: Solar lentigines diffusely   Neurological:     Mental Status: She is alert. Mental status is at baseline.     Cranial Nerves: No cranial nerve deficit.     Motor: No abnormal muscle tone.     Coordination: Coordination normal.     Gait: Gait normal.     Deep Tendon  Reflexes: Reflexes are normal and symmetric. Reflexes normal.  Psychiatric:        Mood and Affect: Mood normal.        Cognition and Memory: Cognition and memory normal.           Assessment & Plan:   Problem List Items Addressed This Visit       Musculoskeletal and Integument   Osteoporosis   Tried alendronate  Caused anxious mood and frequent urination  Declines medication currently   Discussed fall prevention, supplements and exercise for bone density  Offered ref to sport med to discuss options        Relevant Medications   Cholecalciferol  (D3) 25 MCG (1000 UT) capsule   Calcium-Magnesium-Vitamin D  (CALCIUM 1200+D3 PO)   Basal cell carcinoma   Utd derm care Had moh's and healed from that  Uses sun protection         Other   Vitamin D  deficiency   Last vitamin D  Lab Results  Component Value Date   VD25OH 47.45 02/10/2024   Vitamin D  level is therapeutic with current supplementation Disc importance of this to bone and overall health       Pure hypercholesterolemia - Primary   Disc goals for lipids and reasons to control them Rev last labs with pt Rev low sat fat diet in detail LDL up to 144 but HDL also up ASCVD risk 21%  Will work on diet/eating bacon frequently   Offered ref for cardiac ca score-pt will read handout and get back to is if interested       B12 deficiency   Lab Results  Component Value Date   VITAMINB12 461 02/10/2024   Continues current oral supplement

## 2024-04-13 ENCOUNTER — Ambulatory Visit

## 2024-04-13 ENCOUNTER — Encounter

## 2024-04-29 ENCOUNTER — Ambulatory Visit

## 2024-04-29 VITALS — Ht 61.25 in | Wt 136.0 lb

## 2024-04-29 DIAGNOSIS — Z0000 Encounter for general adult medical examination without abnormal findings: Secondary | ICD-10-CM | POA: Diagnosis not present

## 2024-04-29 DIAGNOSIS — Z1231 Encounter for screening mammogram for malignant neoplasm of breast: Secondary | ICD-10-CM

## 2024-04-29 NOTE — Patient Instructions (Addendum)
 Tina Mendez,  Thank you for taking the time for your Medicare Wellness Visit. I appreciate your continued commitment to your health goals. Please review the care plan we discussed, and feel free to reach out if I can assist you further.  Please note that Annual Wellness Visits do not include a physical exam. Some assessments may be limited, especially if the visit was conducted virtually. If needed, we may recommend an in-person follow-up with your provider.  Ongoing Care Seeing your primary care provider every 3 to 6 months helps us  monitor your health and provide consistent, personalized care.   Referrals If a referral was made during today's visit and you haven't received any updates within two weeks, please contact the referred provider directly to check on the status.  You have an order for: DUE 07/30/24 OR AFTER []   2D Mammogram  [x]   3D Mammogram  []   Bone Density     Please call for appointment:  [facility]  [address] [place] [address] [phone]  [facility] [address] [place], [place] [address] [phone]  [facility] [address] [place], [place] [address] [phone]    Make sure to wear two-piece clothing.  No lotions, powders, or deodorants the day of the appointment. Make sure to bring picture ID and insurance card.  Bring list of medications you are currently taking including any supplements.    Recommended Screenings:  Health Maintenance  Topic Date Due   Medicare Annual Wellness Visit  02/09/2024   Flu Shot  07/05/2024*   Zoster (Shingles) Vaccine (1 of 2) 05/19/2025*   COVID-19 Vaccine (2 - Pfizer risk series) 01/11/2028*   Breast Cancer Screening  07/29/2024   Colon Cancer Screening  08/07/2025   DTaP/Tdap/Td vaccine (4 - Tdap) 06/17/2033   Pneumococcal Vaccine for age over 68  Completed    Osteoporosis screening with Bone Density Scan  Completed   Hepatitis C Screening  Completed   Meningitis B Vaccine  Aged Out  *Topic was postponed. The date shown is not the original due date.       08/24/2023    7:48 AM  Advanced Directives  Does Patient Have a Medical Advance Directive? No  Would patient like information on creating a medical advance directive? No - Patient declined    Vision: Annual vision screenings are recommended for early detection of glaucoma, cataracts, and diabetic retinopathy. These exams can also reveal signs of chronic conditions such as diabetes and high blood pressure.  Dental: Annual dental screenings help detect early signs of oral cancer, gum disease, and other conditions linked to overall health, including heart disease and diabetes.  Please see the attached documents for additional preventive care recommendations.

## 2024-04-29 NOTE — Progress Notes (Signed)
 "  Chief Complaint  Patient presents with   Medicare Wellness     Subjective:   Tina Mendez is a 79 y.o. female who presents for a Medicare Annual Wellness Visit.  Visit info / Clinical Intake: Medicare Wellness Visit Type:: Subsequent Annual Wellness Visit Persons participating in visit and providing information:: patient Medicare Wellness Visit Mode:: Telephone If telephone:: video declined Since this visit was completed virtually, some vitals may be partially provided or unavailable. Missing vitals are due to the limitations of the virtual format.: Unable to obtain vitals - no equipment If Telephone or Video please confirm:: I connected with patient using audio/video enable telemedicine. I verified patient identity with two identifiers, discussed telehealth limitations, and patient agreed to proceed. Patient Location:: home Provider Location:: home office Interpreter Needed?: No Pre-visit prep was completed: yes AWV questionnaire completed by patient prior to visit?: yes Date:: 04/28/24 Living arrangements:: (Patient-Rptd) lives with spouse/significant other Patient's Overall Health Status Rating: (Patient-Rptd) very good Typical amount of pain: (Patient-Rptd) none Does pain affect daily life?: (Patient-Rptd) no Are you currently prescribed opioids?: no  Dietary Habits and Nutritional Risks How many meals a day?: (Patient-Rptd) 3 Eats fruit and vegetables daily?: (Patient-Rptd) yes Most meals are obtained by: (Patient-Rptd) preparing own meals In the last 2 weeks, have you had any of the following?: none Diabetic:: no  Functional Status Activities of Daily Living (to include ambulation/medication): (Patient-Rptd) Independent Ambulation: (Patient-Rptd) Independent Medication Administration: (Patient-Rptd) Independent Home Management (perform basic housework or laundry): (Patient-Rptd) Independent Manage your own finances?: (Patient-Rptd) yes Primary transportation is:  (Patient-Rptd) driving Concerns about vision?: no *vision screening is required for WTM* Concerns about hearing?: no  Fall Screening Falls in the past year?: (Patient-Rptd) 0 Number of falls in past year: 0 Was there an injury with Fall?: 0 Fall Risk Category Calculator: 0 Patient Fall Risk Level: Low Fall Risk  Fall Risk Patient at Risk for Falls Due to: No Fall Risks Fall risk Follow up: Falls evaluation completed; Education provided; Falls prevention discussed  Home and Transportation Safety: All rugs have non-skid backing?: (Patient-Rptd) yes All stairs or steps have railings?: (Patient-Rptd) yes Grab bars in the bathtub or shower?: (!) (Patient-Rptd) no Have non-skid surface in bathtub or shower?: (Patient-Rptd) yes Good home lighting?: (Patient-Rptd) yes Regular seat belt use?: (Patient-Rptd) yes Hospital stays in the last year:: (Patient-Rptd) no  Cognitive Assessment Difficulty concentrating, remembering, or making decisions? : (Patient-Rptd) no Will 6CIT or Mini Cog be Completed: yes What year is it?: 0 points What month is it?: 0 points Give patient an address phrase to remember (5 components): [address] [place]  About what time is it?: 0 points Count backwards from 20 to 1: 0 points Say the months of the year in reverse: 0 points Repeat the address phrase from earlier: 0 points 6 CIT Score: 0 points  Advance Directives (For Healthcare) Does Patient Have a Medical Advance Directive?: Yes Does patient want to make changes to medical advance directive?: No - Patient declined Type of Advance Directive: Healthcare Power of [place]; Living will Copy of Healthcare Power of Attorney in Chart?: Yes - validated most recent copy scanned in chart (See row information) Copy of Living Will in Chart?: Yes - validated most recent copy scanned in chart (See row information) Would patient like information on creating a medical advance directive?: No - Patient  declined  Reviewed/Updated  Reviewed/Updated: Reviewed All (Medical, Surgical, Family, Medications, Allergies, Care Teams, Patient Goals)    Allergies (verified) Alendronate  and  Sulfonamide derivatives   Current Medications (verified) Outpatient Encounter Medications as of 04/29/2024  Medication Sig   Calcium-Magnesium-Vitamin D  (CALCIUM 1200+D3 PO) Take 1 capsule by mouth daily. Has 800 iu of D3   Cholecalciferol  (D3) 25 MCG (1000 UT) capsule Take 1,000 Units by mouth daily.   cyanocobalamin  (VITAMIN B12) 500 MCG tablet Take 500 mcg by mouth daily.   No facility-administered encounter medications on file as of 04/29/2024.    History: Past Medical History:  Diagnosis Date   Abdominal pain 05/2018   right lower abd pain/one time   Allergy    pollen   Basal cell carcinoma of skin    mainly on face   Breast cancer (HCC) 2002   Cancer (HCC) 2002   L BREAST/had mastectomy   Colon polyp    Hemorrhoids    hx of    History of anal fissures    Osteoporosis 2020   Plantar fasciitis    Past Surgical History:  Procedure Laterality Date   Basal Cell Lesion  2018   removed from nose/ at [facility] hospital   Breast CA signs - Chemo  06/2000   no radiation   BREAST CYST EXCISION  1970   rt   CESAREAN SECTION  1980   1 time   COLONOSCOPY  2014   COLONOSCOPY  08/2020   Dr abran   IR BONE MARROW BIOPSY & ASPIRATION  08/24/2023   MASTECTOMY  2002   LEFT   Family History  Problem Relation Age of Onset   Diabetes Mother    Heart disease Mother    Heart failure Mother    Varicose Veins Mother    Colon polyps Father    Cancer Paternal Aunt        breast   Cancer Paternal Uncle        Lung CA, smoker   Colon cancer Neg Hx    Breast cancer Neg Hx    Esophageal cancer Neg Hx    Rectal cancer Neg Hx    Stomach cancer Neg Hx    Neuropathy Neg Hx    Neural tube defect Neg Hx    Neurologic Disorder Neg Hx    Social History   Occupational History   Occupation: [employer]    Employer: [employer]  Tobacco Use   Smoking status: Never   Smokeless tobacco: Never  Vaping Use   Vaping status: Never Used  Substance and Sexual Activity   Alcohol use: Never   Drug use: Never   Sexual activity: Not Currently   Tobacco Counseling Counseling given: Not Answered  SDOH Screenings   Food Insecurity: No Food Insecurity (04/29/2024)  Housing: Low Risk (04/29/2024)  Transportation Needs: No Transportation Needs (04/29/2024)  Utilities: Not At Risk (04/29/2024)  Alcohol Screen: Low Risk (02/04/2023)  Depression (PHQ2-9): Low Risk (04/29/2024)  Financial Resource Strain: Low Risk (02/16/2024)  Physical Activity: Insufficiently Active (04/29/2024)  Social Connections: Socially Integrated (04/29/2024)  Stress: No Stress Concern Present (04/29/2024)  Tobacco Use: Low Risk (04/29/2024)  Health Literacy: Adequate Health Literacy (04/29/2024)   See flowsheets for full screening details  Depression Screen PHQ 2 & 9 Depression Scale- Over the past 2 weeks, how often have you been bothered by any of the following problems? Little interest or pleasure in doing things: 0 Feeling down, depressed, or hopeless (PHQ Adolescent also includes...irritable): 0 PHQ-2 Total Score: 0     Goals Addressed  This Visit's Progress    COMPLETED: Increase physical activity       Starting 06/06/16, I will continue to walk on treadmill 30 min 4-5 days per week.      Increase physical activity   On track    Starting 07/03/2017, I will continue to exercise for 30 minutes 5-7 days per week.      COMPLETED: Increase physical activity       Balance exercises      COMPLETED: Patient Stated       12/06/2019, I will continue to walk 5 days a week for 30 minutes.      Patient Stated       I would like to lower my cholesterol             Objective:    Today's Vitals   04/29/24 1349  Weight: 136 lb (61.7 kg)  Height: 5' 1.25 (1.556 m)   Body mass index is  25.49 kg/m.  Hearing/Vision screen No results found. Immunizations and Health Maintenance Health Maintenance  Topic Date Due   Medicare Annual Wellness (AWV)  02/09/2024   Influenza Vaccine  07/05/2024 (Originally 11/06/2023)   Zoster Vaccines- Shingrix (1 of 2) 05/19/2025 (Originally 09/16/1964)   COVID-19 Vaccine (2 - Pfizer risk series) 01/11/2028 (Originally 06/27/2019)   Mammogram  07/29/2024   Colonoscopy  08/07/2025   DTaP/Tdap/Td (4 - Tdap) 06/17/2033   Pneumococcal Vaccine: 50+ Years  Completed   Bone Density Scan  Completed   Hepatitis C Screening  Completed   Meningococcal B Vaccine  Aged Out        Assessment/Plan:  This is a routine wellness examination for Matraca.  Patient Care Team: Tower, Laine LABOR, MD as PCP - Diedre Laughter, Sherlean, MD (Inactive) (General Surgery) Rocky Mace, DO as Consulting Physician (Optometry)  I have personally reviewed and noted the following in the patients chart:   Medical and social history Use of alcohol, tobacco or illicit drugs  Current medications and supplements including opioid prescriptions. Functional ability and status Nutritional status Physical activity Advanced directives List of other physicians Hospitalizations, surgeries, and ER visits in previous 12 months Vitals Screenings to include cognitive, depression, and falls Referrals and appointments  No orders of the defined types were placed in this encounter.  In addition, I have reviewed and discussed with patient certain preventive protocols, quality metrics, and best practice recommendations. A written personalized care plan for preventive services as well as general preventive health recommendations were provided to patient.   Erminio LITTIE Saris, LPN   8/76/7973   No follow-ups on file.  After Visit Summary: (MyChart) Due to this being a telephonic visit, the after visit summary with patients personalized plan was offered to patient via MyChart   Nurse  Notes: No voiced or noted concerns at this time Vaccines not given: Influenza,Shinges, Covid declined today HM Addressed: Mammogram ordered  "

## 2024-08-11 ENCOUNTER — Inpatient Hospital Stay

## 2024-08-17 ENCOUNTER — Other Ambulatory Visit

## 2024-08-24 ENCOUNTER — Inpatient Hospital Stay: Attending: Physician Assistant | Admitting: Hematology and Oncology

## 2025-02-10 ENCOUNTER — Other Ambulatory Visit

## 2025-02-17 ENCOUNTER — Encounter: Admitting: Family Medicine
